# Patient Record
Sex: Male | Born: 1975
Health system: Southern US, Community
[De-identification: ages and names within clinical notes are randomized; demographics above are authoritative.]

## PROBLEM LIST (undated history)

## (undated) DIAGNOSIS — E785 Hyperlipidemia, unspecified: Secondary | ICD-10-CM

## (undated) DIAGNOSIS — I10 Essential (primary) hypertension: Secondary | ICD-10-CM

## (undated) HISTORY — PX: VASECTOMY: SHX75

## (undated) HISTORY — DX: Hyperlipidemia, unspecified: E78.5

---
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
## 2016-05-09 ENCOUNTER — Encounter (HOSPITAL_BASED_OUTPATIENT_CLINIC_OR_DEPARTMENT_OTHER): Payer: Self-pay | Admitting: *Deleted

## 2016-05-09 ENCOUNTER — Emergency Department (HOSPITAL_BASED_OUTPATIENT_CLINIC_OR_DEPARTMENT_OTHER)
Admission: EM | Admit: 2016-05-09 | Discharge: 2016-05-09 | Disposition: A | Discharge status: Home / Self Care | Payer: BLUE CROSS/BLUE SHIELD | Attending: Physician Assistant | Admitting: Physician Assistant

## 2016-05-09 DIAGNOSIS — T65891A Toxic effect of other specified substances, accidental (unintentional), initial encounter: Secondary | ICD-10-CM | POA: Insufficient documentation

## 2016-05-09 DIAGNOSIS — H539 Unspecified visual disturbance: Secondary | ICD-10-CM | POA: Diagnosis not present

## 2016-05-09 DIAGNOSIS — H10211 Acute toxic conjunctivitis, right eye: Secondary | ICD-10-CM | POA: Insufficient documentation

## 2016-05-09 MED ORDER — TETRACAINE HCL 0.5 % OP SOLN
OPHTHALMIC | Status: AC
  Administered 2016-05-09: 10:00:00
  Filled 2016-05-09: qty 4

## 2016-05-09 MED ORDER — POLYMYXIN B-TRIMETHOPRIM 10000-0.1 UNIT/ML-% OP SOLN: 1.0000 [drp] | OPHTHALMIC | 0 refills | Status: DC

## 2016-05-09 MED ORDER — FLUORESCEIN SODIUM 1 MG OP STRP
ORAL_STRIP | OPHTHALMIC | Status: AC
  Administered 2016-05-09: 10:00:00
  Filled 2016-05-09: qty 1

## 2016-05-09 NOTE — Discharge Instructions (Signed)
Read the information below.   Your eye was irrigated in the ED. No corneal abrasions or ulcerations were identified. Your intra-ocular pressure was normal.  You can use OTC artifical tears for sympomatic relief of eye irritation. I have prescribed an antibiotic eye drop. Take as directed.  Call your ophthalmologist ASAP to schedule a follow up for re-evaluation.  Use the prescribed medication as directed.  Please discuss all new medications with your pharmacist.  You may return to the Emergency Department at any time for worsening condition or any new symptoms that concern you. Return to ED if you develop  changes in vision, purulent drainage, or pain with eye movement.

## 2016-05-09 NOTE — ED Triage Notes (Signed)
Pt reports splashing bleach onto his face while cleaning this morning. Pt states he got into the shower and flushed both eyes with water. Denies any pain at this time, "it just feels a little irritated." pt denies any visual changes.

## 2016-05-09 NOTE — ED Notes (Signed)
Eye irrigation complete. Tolerated well.

## 2016-05-09 NOTE — ED Provider Notes (Signed)
[facility] Provider Note   CSN: [id number] Arrival date & time: 05/09/16  [id number]  First Provider Contact:  First MD Initiated Contact with Patient 05/09/16 [id number]        History   Chief Complaint Chief Complaint  Patient presents with  . Eye Problem    HPI Jeremy Carlson is a 40 y.o. male.  Jeremy Carlson is a 40 y.o. Male with history of hyperlipidemia presents to ED with complaint of bleach in eye. Patient reports that he was cleaning the floor this morning when the cap on the bleach popped off splashing bleach into his right eye. He was wearing glasses at the time. He irrigated his right eye in the sink followed by in the shower for 10 minutes prior to arrival. He reports he initially had some blurry vision after irrigation; however, that has since resolved. He does endorse some right eye redness and irritation. He denies a foreign body sensation, pain with ocular movements, photophobia, eye discharge, eye itching. No other trauma reported. He reports "caffeine" headache that is mild. He denies any other complaints. He does have an ophthalmologist.      History reviewed. No pertinent past medical history.  There are no active problems to display for this patient.   History reviewed. No pertinent surgical history.     Home Medications    Prior to Admission medications   Medication Sig Start Date End Date Taking? Authorizing Provider  trimethoprim-polymyxin b (POLYTRIM) ophthalmic solution Place 1 drop into the right eye every 4 (four) hours. For 7 days 05/09/16   Roxanna Mew, PA-C    Family History History reviewed. No pertinent family history.  Social History Social History  Substance Use Topics  . Smoking status: Never Smoker  . Smokeless tobacco: Never Used  . Alcohol use Not on file     Allergies   Review of patient's allergies indicates no known allergies.   Review of Systems Review of Systems  Eyes: Positive for pain (  "irritation" no pain with EOMs), redness and visual disturbance (blurry vision after irrigation, resolved).  Neurological: Positive for headaches ( "caffeine headache," slight).  All other systems reviewed and are negative.    Physical Exam Updated Vital Signs BP (!) 143/102 (BP Location: Left Arm)   Pulse 80   Temp 98.2 F (36.8 C) (Oral)   Resp 18   Ht 5\' 8"  (1.727 m)   Wt 90.7 kg   SpO2 99%   BMI 30.41 kg/m   Physical Exam  Constitutional: He appears well-developed and well-nourished. No distress.  HENT:  Head: Normocephalic and atraumatic.  Eyes: EOM and lids are normal. Right eye exhibits no chemosis, no discharge, no exudate and no hordeolum. No foreign body present in the right eye. Left eye exhibits no chemosis, no discharge, no exudate and no hordeolum. No foreign body present in the left eye. Right conjunctiva is injected ( mild). Right conjunctiva has no hemorrhage. Left conjunctiva is not injected. Left conjunctiva has no hemorrhage. No scleral icterus.  Fundoscopic exam:      The right eye shows no AV nicking, no exudate and no hemorrhage. The right eye shows red reflex.       The left eye shows no AV nicking, no exudate and no hemorrhage. The left eye shows red reflex.  Visual acuity is 20/20 L, 20/15 R, uncorrected. No periorbital swelling. No proptosis. No eye lid lacerations. Lids are soft. No TTP or bony defects of orbital rim. PERRL. PH is  normal. IOP is 17. On fluorescein stain - no corneal foreign bodies, abrasions, or ulcerations. Anterior chamber negative for hyphema or hypopyon.   Neck: Normal range of motion.  Abdominal: Soft. Bowel sounds are normal. There is no tenderness.  Skin: He is not diaphoretic.     ED Treatments / Results  Labs (all labs ordered are listed, but only abnormal results are displayed) Labs Reviewed - No data to display  EKG  EKG Interpretation None       Radiology No results found.  Procedures Procedures (including  critical care time)  Medications Ordered in ED Medications  fluorescein 1 MG ophthalmic strip (  Given 05/09/16 0940)  tetracaine (PONTOCAINE) 0.5 % ophthalmic solution (  Given 05/09/16 0940)     Initial Impression / Assessment and Plan / ED Course  I have reviewed the triage vital signs and the nursing notes.  Pertinent labs & imaging results that were available during my care of the patient were reviewed by me and considered in my medical decision making (see chart for details).  Clinical Course    Patient presents to ED following eye chemical exposure. Visual acuity is 20/20 right, 20/15 in left corrected. Mild redness noted to right conjunctiva. EOMs intact without pain. Grossly normal funduscopic exam. Normal slit lamp. PH normal. No corneal abrasion or ulceration. IOP 17. Will irrigate with Lilia Pro lens.  Suspect chemical conjunctivitis. Symptomatic management discussed. Rx antibiotic eye drops. Encouraged follow-up with ophthalmologist in next 2-3 days for reevaluation. Discussed return precautions. Patient voiced understanding and is agreeable.     Final Clinical Impressions(s) / ED Diagnoses   Final diagnoses:  Chemical conjunctivitis, right    New Prescriptions New Prescriptions   TRIMETHOPRIM-POLYMYXIN B (POLYTRIM) OPHTHALMIC SOLUTION    Place 1 drop into the right eye every 4 (four) hours. For 7 days     Roxanna Mew, [place] 05/09/16 [place], MD 05/09/16 1119

## 2016-05-09 NOTE — ED Notes (Signed)
Morgan lens to right eye with NS infusing. Tolerating well.

## 2019-06-24 ENCOUNTER — Other Ambulatory Visit: Payer: Self-pay

## 2019-06-24 ENCOUNTER — Ambulatory Visit: Payer: Self-pay

## 2019-06-24 DIAGNOSIS — Z23 Encounter for immunization: Secondary | ICD-10-CM

## 2020-01-11 ENCOUNTER — Encounter: Payer: Self-pay | Admitting: Medical

## 2020-01-11 ENCOUNTER — Other Ambulatory Visit: Payer: Self-pay

## 2020-01-11 ENCOUNTER — Ambulatory Visit (INDEPENDENT_AMBULATORY_CARE_PROVIDER_SITE_OTHER): Payer: No Typology Code available for payment source | Admitting: Medical

## 2020-01-11 VITALS — BP 130/80 | HR 81 | Temp 98.4°F | Resp 18 | Ht 68.0 in | Wt 194.4 lb

## 2020-01-11 DIAGNOSIS — R030 Elevated blood-pressure reading, without diagnosis of hypertension: Secondary | ICD-10-CM | POA: Insufficient documentation

## 2020-01-11 DIAGNOSIS — E785 Hyperlipidemia, unspecified: Secondary | ICD-10-CM | POA: Insufficient documentation

## 2020-01-11 NOTE — Progress Notes (Signed)
Subjective:    Patient ID: Jeremy Carlson, male    DOB: [birth year], 44 y.o.   MRN: [id number]  HPI  Pt in for first time.   He was going to [facility] or [facility]. 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 Pt just started to watch what he is eating. He has been walking with his wife. He does not drink alcohol. None smoker.  Pt has lost 9-10 pounds with purposeful weight loss over a month. He noticed his blood pressure has his bp has been 140/90 on average. When he gave blood they told him he had high blood pressure.   Today earlier when he took his bp was 124/86. But first reading was 140/90.  He does 2-3 readings at a time.  Pt bp was 123/86 before he came in.  In the past he had some high cholesterol.  Pt has new blood pressure cuff.  Pt bp initially on checking was 150/80.      Review of Systems  Constitutional: Negative for chills, fatigue and fever.  Respiratory: Negative for cough, chest tightness, shortness of breath and wheezing.   Cardiovascular: Negative for chest pain and palpitations.  Gastrointestinal: Negative for abdominal pain.  Genitourinary: Negative for dysuria and frequency.  Musculoskeletal: Negative for back pain.  Skin: Negative for rash and wound.  Neurological: Negative for dizziness, syncope, weakness and light-headedness.  Hematological: Negative for adenopathy. Does not bruise/bleed easily.  Psychiatric/Behavioral: Negative for agitation, confusion, sleep disturbance and suicidal ideas. The patient is not nervous/anxious.     No past medical history on file.   Social History   Socioeconomic History  . Marital status: Married    Spouse name: Not on file  . Number of children: Not on file  . Years of education: Not on file  . Highest education level: Not on file  Occupational History  . Not on file  Tobacco Use  . Smoking status: Never Smoker  . Smokeless tobacco: Never Used  Substance and Sexual Activity  .  Alcohol use: Not on file  . Drug use: Not on file  . Sexual activity: Not on file  Other Topics Concern  . Not on file  Social History Narrative  . Not on file   Social Determinants of Health   Financial Resource Strain:   . Difficulty of Paying Living Expenses:   Food Insecurity:   . Worried About Charity fundraiser in the Last Year:   . Arboriculturist in the Last Year:   Transportation Needs:   . Film/video editor (Medical):   Marland Kitchen Lack of Transportation (Non-Medical):   Physical Activity:   . Days of Exercise per Week:   . Minutes of Exercise per Session:   Stress:   . Feeling of Stress :   Social Connections:   . Frequency of Communication with Friends and Family:   . Frequency of Social Gatherings with Friends and Family:   . Attends Religious Services:   . Active Member of Clubs or Organizations:   . Attends Archivist Meetings:   Marland Kitchen Marital Status:   Intimate Partner Violence:   . Fear of Current or Ex-Partner:   . Emotionally Abused:   Marland Kitchen Physically Abused:   . Sexually Abused:     No past surgical history on file.  No family history on file.  No Known Allergies  Current Outpatient Medications on File Prior to Visit  Medication Sig Dispense Refill  . trimethoprim-polymyxin b (POLYTRIM)  ophthalmic solution Place 1 drop into the right eye every 4 (four) hours. For 7 days (Patient not taking: Reported on 01/11/2020) 10 mL 0   No current facility-administered medications on file prior to visit.    BP (!) 149/106 (BP Location: Left Arm, Patient Position: Sitting, Cuff Size: Large)   Pulse 81   Temp 98.4 F (36.9 C) (Temporal)   Resp 18   Ht 5\' 8"  (1.727 m)   Wt 194 lb 6.4 oz (88.2 kg)   SpO2 100%   BMI 29.56 kg/m       Objective:   Physical Exam   General Mental Status- Alert. General Appearance- Not in acute distress.   Skin General: Color- Normal Color. Moisture- Normal Moisture.  Neck Carotid Arteries- Normal color. Moisture-  Normal Moisture. No carotid bruits. No JVD.  Chest and Lung Exam Auscultation: Breath Sounds:-Normal.  Cardiovascular Auscultation:Rythm- Regular. Murmurs & Other Heart Sounds:Auscultation of the heart reveals- No Murmurs.  Abdomen Inspection:-Inspeection Normal. Palpation/Percussion:Note:No mass. Palpation and Percussion of the abdomen reveal- Non Tender, Non Distended + BS, no rebound or guarding.   Neurologic Cranial Nerve exam:- CN III-XII intact(No nystagmus), symmetric smile. Strength:- 5/5 equal and symmetric strength both upper and lower extremities.     Assessment & Plan:  Your blood pressure readings have been high recently but better as you lost weight and reduced salt. You at home readings and readings today indicate you may have some white coat type bp elevation. So continue with weight loss and low salt diet. Will check you bp machine against manual check. Won't give medications presently.  Will include lipid panel with your hx of some elevation in the past.   Follow up 3 weeks early morning wellness exam. Come in fasting.  Time spent with new patient today was 30  minutes which consisted of chart review, discussing elevated bp, monitor and potential treatment if necessary and documentation.  Mackie Pai, PA-C

## 2020-01-11 NOTE — Patient Instructions (Signed)
Your blood pressure readings have been high recently but better as you lost weight and reduced salt. You at home readings and readings today indicate you may have some white coat type bp elevation. So continue with weight loss and low salt diet. Will check you bp machine against manual check. Won't give medications presently.  Will include lipid panel with your hx of some elevation in the past.   Follow up 3 weeks early morning wellness exam. Come in fasting.

## 2020-02-01 ENCOUNTER — Other Ambulatory Visit: Payer: Self-pay

## 2020-02-01 ENCOUNTER — Ambulatory Visit (INDEPENDENT_AMBULATORY_CARE_PROVIDER_SITE_OTHER): Payer: No Typology Code available for payment source | Admitting: Medical

## 2020-02-01 ENCOUNTER — Encounter: Payer: Self-pay | Admitting: Medical

## 2020-02-01 VITALS — BP 137/99 | HR 84 | Temp 96.9°F | Resp 18 | Ht 68.0 in | Wt 190.2 lb

## 2020-02-01 DIAGNOSIS — Z1211 Encounter for screening for malignant neoplasm of colon: Secondary | ICD-10-CM | POA: Diagnosis not present

## 2020-02-01 DIAGNOSIS — R030 Elevated blood-pressure reading, without diagnosis of hypertension: Secondary | ICD-10-CM

## 2020-02-01 DIAGNOSIS — Z1283 Encounter for screening for malignant neoplasm of skin: Secondary | ICD-10-CM

## 2020-02-01 DIAGNOSIS — Z0000 Encounter for general adult medical examination without abnormal findings: Secondary | ICD-10-CM | POA: Diagnosis not present

## 2020-02-01 DIAGNOSIS — E663 Overweight: Secondary | ICD-10-CM | POA: Diagnosis not present

## 2020-02-01 DIAGNOSIS — Z113 Encounter for screening for infections with a predominantly sexual mode of transmission: Secondary | ICD-10-CM

## 2020-02-01 DIAGNOSIS — R0683 Snoring: Secondary | ICD-10-CM | POA: Diagnosis not present

## 2020-02-01 LAB — COMPREHENSIVE METABOLIC PANEL
ALT: 14 U/L (ref 0–53)
AST: 14 U/L (ref 0–37)
Albumin: 4.5 g/dL (ref 3.5–5.2)
Alkaline Phosphatase: 80 U/L (ref 39–117)
BUN: 13 mg/dL (ref 6–23)
CO2: 27 mEq/L (ref 19–32)
Calcium: 9.2 mg/dL (ref 8.4–10.5)
Chloride: 104 mEq/L (ref 96–112)
Creatinine, Ser: 0.8 mg/dL (ref 0.40–1.50)
GFR: 104.49 mL/min (ref >60.00)
Glucose, Bld: 91 mg/dL (ref 70–99)
Potassium: 3.7 mEq/L (ref 3.5–5.1)
Sodium: 139 mEq/L (ref 135–145)
Total Bilirubin: 0.4 mg/dL (ref 0.2–1.2)
Total Protein: 7.3 g/dL (ref 6.0–8.3)

## 2020-02-01 LAB — CBC WITH DIFFERENTIAL/PLATELET
Basophils Absolute: 0.1 10*3/uL (ref 0.0–0.1)
Basophils Relative: 1.2 % (ref 0.0–3.0)
Eosinophils Absolute: 0.1 10*3/uL (ref 0.0–0.7)
Eosinophils Relative: 1.3 % (ref 0.0–5.0)
HCT: 42.8 % (ref 39.0–52.0)
Hemoglobin: 14.5 g/dL (ref 13.0–17.0)
Lymphocytes Relative: 23.2 % (ref 12.0–46.0)
Lymphs Abs: 1.7 10*3/uL (ref 0.7–4.0)
MCHC: 33.8 g/dL (ref 30.0–36.0)
MCV: 87.8 fl (ref 78.0–100.0)
Monocytes Absolute: 0.5 10*3/uL (ref 0.1–1.0)
Monocytes Relative: 6.5 % (ref 3.0–12.0)
Neutro Abs: 4.9 10*3/uL (ref 1.4–7.7)
Neutrophils Relative %: 67.8 % (ref 43.0–77.0)
Platelets: 216 10*3/uL (ref 150.0–400.0)
RBC: 4.88 Mil/uL (ref 4.22–5.81)
RDW: 13.5 % (ref 11.5–15.5)
WBC: 7.2 10*3/uL (ref 4.0–10.5)

## 2020-02-01 LAB — LIPID PANEL
Cholesterol: 196 mg/dL (ref 0–200)
HDL: 35.5 mg/dL — ABNORMAL LOW (ref >39.00)
LDL Cholesterol: 135 mg/dL — ABNORMAL HIGH (ref 0–99)
NonHDL: 160.89
Total CHOL/HDL Ratio: 6
Triglycerides: 129 mg/dL (ref 0.0–149.0)
VLDL: 25.8 mg/dL (ref 0.0–40.0)

## 2020-02-01 NOTE — Patient Instructions (Addendum)
For you wellness exam today I have ordered cbc, cmp, lipid panel and hiv.  Vaccine up to date except pt not sure about tdap. Can give later if you want to see if greater than 10 years and update [place].  Recommend exercise and healthy diet.  We will let you know lab results as they come in.  Follow up date appointment will be determined after lab review.   Referral for screening colonoscopy.  For elevated bp in office but better at home continue to check at home and relax. Want to see most of time less than 130/80. If closer to 140/90 then rx low dose medication.  Overweight but succesfully loosing wt. Will put in referral for nutritionist.  For snoring placed referral to specialist. May get sleep study.   Preventive Care 22-62 Years Old, Male Preventive care refers to lifestyle choices and visits with your health care provider that can promote health and wellness. This includes:  A yearly physical exam. This is also called an annual well check.  Regular dental and eye exams.  Immunizations.  Screening for certain conditions.  Healthy lifestyle choices, such as eating a healthy diet, getting regular exercise, not using drugs or products that contain nicotine and tobacco, and limiting alcohol use. What can I expect for my preventive care visit? Physical exam Your health care provider will check:  Height and weight. These may be used to calculate body mass index (BMI), which is a measurement that tells if you are at a healthy weight.  Heart rate and blood pressure.  Your skin for abnormal spots. Counseling Your health care provider may ask you questions about:  Alcohol, tobacco, and drug use.  Emotional well-being.  Home and relationship well-being.  Sexual activity.  Eating habits.  Work and work Statistician. What immunizations do I need?  Influenza (flu) vaccine  This is recommended every year. Tetanus, diphtheria, and pertussis (Tdap) vaccine  You may need a  Td booster every 10 years. Varicella (chickenpox) vaccine  You may need this vaccine if you have not already been vaccinated. Zoster (shingles) vaccine  You may need this after age 43. Measles, mumps, and rubella (MMR) vaccine  You may need at least one dose of MMR if you were born in 1957 or later. You may also need a second dose. Pneumococcal conjugate (PCV13) vaccine  You may need this if you have certain conditions and were not previously vaccinated. Pneumococcal polysaccharide (PPSV23) vaccine  You may need one or two doses if you smoke cigarettes or if you have certain conditions. Meningococcal conjugate (MenACWY) vaccine  You may need this if you have certain conditions. Hepatitis A vaccine  You may need this if you have certain conditions or if you travel or work in places where you may be exposed to hepatitis A. Hepatitis B vaccine  You may need this if you have certain conditions or if you travel or work in places where you may be exposed to hepatitis B. Haemophilus influenzae type b (Hib) vaccine  You may need this if you have certain risk factors. Human papillomavirus (HPV) vaccine  If recommended by your health care provider, you may need three doses over 6 months. You may receive vaccines as individual doses or as more than one vaccine together in one shot (combination vaccines). Talk with your health care provider about the risks and benefits of combination vaccines. What tests do I need? Blood tests  Lipid and cholesterol levels. These may be checked every 5 years, or  more frequently if you are over 53 years old.  Hepatitis C test.  Hepatitis B test. Screening  Lung cancer screening. You may have this screening every year starting at age 21 if you have a 30-pack-year history of smoking and currently smoke or have quit within the past 15 years.  Prostate cancer screening. Recommendations will vary depending on your family history and other  risks.  Colorectal cancer screening. All adults should have this screening starting at age 36 and continuing until age 59. Your health care provider may recommend screening at age 54 if you are at increased risk. You will have tests every 1-10 years, depending on your results and the type of screening test.  Diabetes screening. This is done by checking your blood sugar (glucose) after you have not eaten for a while (fasting). You may have this done every 1-3 years.  Sexually transmitted disease (STD) testing. Follow these instructions at home: Eating and drinking  Eat a diet that includes fresh fruits and vegetables, whole grains, lean protein, and low-fat dairy products.  Take vitamin and mineral supplements as recommended by your health care provider.  Do not drink alcohol if your health care provider tells you not to drink.  If you drink alcohol: ? Limit how much you have to 0-2 drinks a day. ? Be aware of how much alcohol is in your drink. In the [place] one drink equals one 12 oz bottle of beer (355 mL), one 5 oz glass of wine (148 mL), or one 1 oz glass of hard liquor (44 mL). Lifestyle  Take daily care of your teeth and gums.  Stay active. Exercise for at least 30 minutes on 5 or more days each week.  Do not use any products that contain nicotine or tobacco, such as cigarettes, e-cigarettes, and chewing tobacco. If you need help quitting, ask your health care provider.  If you are sexually active, practice safe sex. Use a condom or other form of protection to prevent STIs (sexually transmitted infections).  Talk with your health care provider about taking a low-dose aspirin every day starting at age 69. What's next?  Go to your health care provider once a year for a well check visit.  Ask your health care provider how often you should have your eyes and teeth checked.  Stay up to date on all vaccines. This information is not intended to replace advice given to you by your  health care provider. Make sure you discuss any questions you have with your health care provider. Document Revised: 09/18/2018 Document Reviewed: 09/18/2018 Elsevier Patient Education  2020 [employer].

## 2020-02-01 NOTE — Progress Notes (Signed)
Subjective:    Patient ID: Jeremy Carlson, male    DOB: Aug 15, 1976, 44 y.o.   MRN: [id number]  HPI  Pt in for cpe/wellness. He is fasting.  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 Pt just started to watch what he is eating. He has been walking with his wife. He does not drink alcohol. None smoker.  This morning when he took his bp was 132/79.  After flying and coming back from [place] his bp was 159/109. In office before I checked he got 144/94 and his second check was 136/96.  At home pt average bp at 116/82.   He has been exercising.   Pt not sure if up to date on tdap. Pt got his covid vaccines in February.    Pt still dieting and continue to have purposeful weight loss.   Review of Systems  Constitutional: Negative for chills, fatigue and fever.  Respiratory: Negative for cough, chest tightness, shortness of breath and wheezing.        Pt does snore per his wife. Wife thinks at time he gasping for air. He fells moderately well rested.  Cardiovascular: Negative for chest pain and palpitations.  Gastrointestinal: Negative for abdominal pain.  Genitourinary: Negative for decreased urine volume, dysuria and testicular pain.  Musculoskeletal: Negative for back pain.  Skin: Negative for rash.  Neurological: Positive for weakness. Negative for dizziness, speech difficulty, numbness and headaches.  Hematological: Negative for adenopathy. Does not bruise/bleed easily.  Psychiatric/Behavioral: Negative for behavioral problems, dysphoric mood and sleep disturbance. The patient is not nervous/anxious.    Past Medical History:  Diagnosis Date  . Hyperlipidemia      Social History   Socioeconomic History  . Marital status: Married    Spouse name: Not on file  . Number of children: Not on file  . Years of education: Not on file  . Highest education level: Not on file  Occupational History  . Not on file  Tobacco Use  . Smoking status: Never Smoker  .  Smokeless tobacco: Never Used  Substance and Sexual Activity  . Alcohol use: Not Currently    Comment: rare  . Drug use: Never  . Sexual activity: Yes  Other Topics Concern  . Not on file  Social History Narrative  . Not on file   Social Determinants of Health   Financial Resource Strain:   . Difficulty of Paying Living Expenses:   Food Insecurity:   . Worried About Charity fundraiser in the Last Year:   . Arboriculturist in the Last Year:   Transportation Needs:   . Film/video editor (Medical):   Marland Kitchen Lack of Transportation (Non-Medical):   Physical Activity:   . Days of Exercise per Week:   . Minutes of Exercise per Session:   Stress:   . Feeling of Stress :   Social Connections:   . Frequency of Communication with Friends and Family:   . Frequency of Social Gatherings with Friends and Family:   . Attends Religious Services:   . Active Member of Clubs or Organizations:   . Attends Archivist Meetings:   Marland Kitchen Marital Status:   Intimate Partner Violence:   . Fear of Current or Ex-Partner:   . Emotionally Abused:   Marland Kitchen Physically Abused:   . Sexually Abused:     Past Surgical History:  Procedure Laterality Date  . VASECTOMY      Family History  Problem Relation Age of Onset  .  Hyperlipidemia Mother   . Hypertension Mother   . Hyperlipidemia Father   . Hypertension Father     No Known Allergies  Current Outpatient Medications on File Prior to Visit  Medication Sig Dispense Refill  . trimethoprim-polymyxin b (POLYTRIM) ophthalmic solution Place 1 drop into the right eye every 4 (four) hours. For 7 days (Patient not taking: Reported on 01/11/2020) 10 mL 0   No current facility-administered medications on file prior to visit.    BP 138/88 (BP Location: Left Arm, Patient Position: Sitting, Cuff Size: Large)   Pulse 84   Temp (!) 96.9 F (36.1 C) (Temporal)   Resp 18   Ht 5\' 8"  (1.727 m)   Wt 190 lb 3.2 oz (86.3 kg)   SpO2 100%   BMI 28.92 kg/m        Objective:   Physical Exam  General Mental Status- Alert. General Appearance- Not in acute distress.   Skin One mole on back little darker than other but not large and slight irregular border. Left rig /under axillary are 5 skin tags.  Neck Carotid Arteries- Normal color. Moisture- Normal Moisture. No carotid bruits. No JVD.  Chest and Lung Exam Auscultation: Breath Sounds:-Normal.  Cardiovascular Auscultation:Rythm- Regular. Murmurs & Other Heart Sounds:Auscultation of the heart reveals- No Murmurs.  Abdomen Inspection:-Inspeection Normal. Palpation/Percussion:Note:No mass. Palpation and Percussion of the abdomen reveal- Non Tender, Non Distended + BS, no rebound or guarding.   Neurologic Cranial Nerve exam:- CN III-XII intact(No nystagmus), symmetric smile. Strength:- 5/5 equal and symmetric strength both upper and lower extremities.      Assessment & Plan:  For you wellness exam today I have ordered cbc, cmp, lipid panel and hiv.  Vaccine up to date except pt not sure about tdap. Can give later if you want to see if greater than 10 years and update [place].  Recommend exercise and healthy diet.  We will let you know lab results as they come in.  Follow up date appointment will be determined after lab review.   Referral for screening colonoscopy.  For elevated bp in office but better at home continue to check at home and relax. Want to see most of time less than 130/80. If closer to 140/90 then rx low dose medication.  Overweight but succcesfully loosing wt. Will put in referral for nutritionist.  For snoring placed referal to specialist. May get sleep study.   [id number] charge as well as addition 10 minutes discussing bp, weight loss, referral to nutritionist, and snoring/referral.

## 2020-02-02 LAB — HIV ANTIBODY (ROUTINE TESTING W REFLEX): HIV 1&2 Ab, 4th Generation: NONREACTIVE

## 2020-02-08 ENCOUNTER — Encounter: Payer: Self-pay | Admitting: Medical

## 2020-02-08 NOTE — Addendum Note (Signed)
Addended by: Anabel Halon on: 02/08/2020 02:01 PM   Modules accepted: Orders

## 2020-02-15 ENCOUNTER — Encounter: Payer: No Typology Code available for payment source | Attending: Medical | Admitting: Skilled Nursing Facility1

## 2020-02-15 ENCOUNTER — Other Ambulatory Visit: Payer: Self-pay

## 2020-02-15 ENCOUNTER — Encounter: Payer: Self-pay | Admitting: Skilled Nursing Facility1

## 2020-02-15 DIAGNOSIS — E785 Hyperlipidemia, unspecified: Secondary | ICD-10-CM | POA: Insufficient documentation

## 2020-02-15 NOTE — Progress Notes (Addendum)
  Assessment:  Primary concerns today: general health   Pt states he has cut out caffeine, added sugar, and is on a low sodium diet and with that have controlled blood pressures more often than previously witth he goal being 130/80. Pt states he has never had a blood pressure issue which worried him enough to make a lot changes. Pt states he has lost 20 pounds since January. Pt states he now walks/jogs 30-60 minutes 5-6 days a week. Pt state she had always had headaches and had to rely on caffeone and needed to be awake but does not have those issues any longer increases his motivation. Pt states he does not get good sleep and feels he needs to lose 20 pounds. Pt states he is going to get a Sleep study soon. Pt states he uses My[employer]itness Pal daily and writes down everything he puts in his mouth.   Body Comp scale:  Wt: 188 % fat: 22.7 Total body water percent: 58.6%  MEDICATIONS: see list   DIETARY INTAKE:  Usual eating pattern includes 3 meals and 2 snacks per day.  Everyday foods include oatmeal  Avoided foods include ihgh sugar/fat.    24-hr recall:  B ( AM): oatmeal With cinnamon with banana and blueberries and greek yogurt with almonds  Snk ( AM): carrots + celery + plain greek yogurt with ranch seasoning  L ( PM): salad: green leaf + carrots + sweet potato in avacado oil + chicken breast + black beans + avacado Snk ( PM): apple or orange  D ( PM): salmon + asparaus + red potatoes  Snk ( PM): sometimes flavored yogurt and dark chocolate  Beverages: water  Usual physical activity: 30-60 minutes walk/jog, tomato juice  Estimated energy needs: 1500-1700 calories  Progress Towards Goal(s):  In progress.     Intervention:  Nutrition counseling. Dietitian advised pt his diet is perfect and he has done an amazing job doing right by his body.   Goals: Let the sodium go; you are not eating anything that would increase it If you continue to lose weight 2+ pounds per week increase your  calories  Increase resistance exercises to 2-3 days a week If counting calories/logging becomes stressful let it go  Teaching Method Utilized:  Visual Auditory Hands on   Barriers to learning/adherence to lifestyle change: none identified   Demonstrated degree of understanding via:  Teach Back   Monitoring/Evaluation:  Dietary intake, exercise,and body weight prn.

## 2020-02-25 ENCOUNTER — Encounter: Payer: Self-pay | Admitting: Pulmonary Disease

## 2020-02-25 ENCOUNTER — Ambulatory Visit (INDEPENDENT_AMBULATORY_CARE_PROVIDER_SITE_OTHER): Payer: No Typology Code available for payment source | Admitting: Pulmonary Disease

## 2020-02-25 ENCOUNTER — Other Ambulatory Visit: Payer: Self-pay

## 2020-02-25 VITALS — BP 126/88 | HR 86 | Temp 98.3°F | Ht 68.0 in | Wt 187.6 lb

## 2020-02-25 DIAGNOSIS — G478 Other sleep disorders: Secondary | ICD-10-CM | POA: Diagnosis not present

## 2020-02-25 DIAGNOSIS — R0683 Snoring: Secondary | ICD-10-CM | POA: Diagnosis not present

## 2020-02-25 NOTE — Progress Notes (Signed)
Jeremy Carlson    [id number]    [birth year]  Primary Care Physician:Saguier, Iris Pert  Referring Physician: Mackie Pai, PA-C [address] [place],  [place] [address]   Chief complaint:   Patient with a history of snoring, nonrestorative sleep  HPI:  Has been having sleep issues for few years Usually goes to bed between 10 and 11 Falls asleep in about 30 minutes 1-3 awakenings Final wake up time between 7 and 8 AM Weight is down about 20 pounds recently  He wakes up most mornings feeling like he had a decent rest Usually has a glass of water by his bedside which he reaches for early in the morning but does not feel he gets too dry He has no headaches currently used to His mom snored  Non-smoker   Outpatient Encounter Medications as of 02/25/2020  Medication Sig  . loratadine (CLARITIN) 10 MG tablet Take 10 mg by mouth daily.  . magnesium 30 MG tablet Take 30 mg by mouth daily.  . Multiple Vitamin (ONE-A-DAY MENS PO) Take by mouth.  . omega-3 acid ethyl esters (LOVAZA) 1 g capsule Take 1 g by mouth daily.  . [DISCONTINUED] trimethoprim-polymyxin b (POLYTRIM) ophthalmic solution Place 1 drop into the right eye every 4 (four) hours. For 7 days (Patient not taking: Reported on 01/11/2020)   No facility-administered encounter medications on file as of 02/25/2020.    Allergies as of 02/25/2020  . (No Known Allergies)    Past Medical History:  Diagnosis Date  . Hyperlipidemia   . Hypertension     Past Surgical History:  Procedure Laterality Date  . VASECTOMY      Family History  Problem Relation Age of Onset  . Hyperlipidemia Mother   . Hypertension Mother   . Hyperlipidemia Father   . Hypertension Father     Social History   Socioeconomic History  . Marital status: Married    Spouse name: Not on file  . Number of children: Not on file  . Years of education: Not on file  . Highest education level: Not on file   Occupational History  . Not on file  Tobacco Use  . Smoking status: Never Smoker  . Smokeless tobacco: Never Used  Substance and Sexual Activity  . Alcohol use: Not Currently    Comment: rare  . Drug use: Never  . Sexual activity: Yes  Other Topics Concern  . Not on file  Social History Narrative  . Not on file   Social Determinants of Health   Financial Resource Strain:   . Difficulty of Paying Living Expenses:   Food Insecurity:   . Worried About Charity fundraiser in the Last Year:   . Arboriculturist in the Last Year:   Transportation Needs:   . Film/video editor (Medical):   Marland Kitchen Lack of Transportation (Non-Medical):   Physical Activity:   . Days of Exercise per Week:   . Minutes of Exercise per Session:   Stress:   . Feeling of Stress :   Social Connections:   . Frequency of Communication with Friends and Family:   . Frequency of Social Gatherings with Friends and Family:   . Attends Religious Services:   . Active Member of Clubs or Organizations:   . Attends Archivist Meetings:   Marland Kitchen Marital Status:   Intimate Partner Violence:   . Fear of Current or Ex-Partner:   .  Emotionally Abused:   Marland Kitchen Physically Abused:   . Sexually Abused:     Review of Systems  Constitutional: Negative.   HENT: Negative.   Psychiatric/Behavioral: Positive for sleep disturbance.    Vitals:   02/25/20 1558  BP: 126/88  Pulse: 86  Temp: 98.3 F (36.8 C)  SpO2: 98%     Physical Exam  HENT:  Head: Normocephalic.  Mallampati 4, crowded oropharynx  Eyes: Pupils are equal, round, and reactive to light. Right eye exhibits no discharge. Left eye exhibits no discharge.  Neck: No tracheal deviation present. No thyromegaly present.  Cardiovascular: Normal rate and regular rhythm.  Pulmonary/Chest: Effort normal and breath sounds normal. No respiratory distress. He has no wheezes. He has no rales. He exhibits no tenderness.  Neurological: He is alert.  Psychiatric: He  has a normal mood and affect.   Results of the Epworth flowsheet 02/25/2020  Sitting and reading 1  Watching TV 1  Sitting, inactive in a public place (e.g. a theatre or a meeting) 0  As a passenger in a car for an hour without a break 0  Lying down to rest in the afternoon when circumstances permit 2  Sitting and talking to someone 0  Sitting quietly after a lunch without alcohol 1  In a car, while stopped for a few minutes in traffic 0  Total score 5    Assessment:  Moderate probability of significant obstructive sleep apnea  Daytime sleepiness  Pathophysiology of sleep disordered breathing discussed with the patient Treatment options for sleep disordered breathing discussed with the patient  Plan/Recommendations: Risk of not treating sleep disordered breathing discussed  We will schedule the patient for home sleep study  Follow-up in 2 to 3 months  Encouraged to continue weight loss efforts     Sherrilyn Rist MD [facility] 02/25/2020, 4:20 PM  CC: Mackie Pai, PA-C

## 2020-02-25 NOTE — Patient Instructions (Signed)
Moderate probability of significant obstructive sleep apnea  We will schedule you for home sleep study Treatment options as discussed  Update you with results  Follow-up in 2 to 3 months  Call with significant concerns Sleep Apnea Sleep apnea is a condition in which breathing pauses or becomes shallow during sleep. Episodes of sleep apnea usually last 10 seconds or longer, and they may occur as many as 20 times an hour. Sleep apnea disrupts your sleep and keeps your body from getting the rest that it needs. This condition can increase your risk of certain health problems, including:  Heart attack.  Stroke.  Obesity.  Diabetes.  Heart failure.  Irregular heartbeat. What are the causes? There are three kinds of sleep apnea:  Obstructive sleep apnea. This kind is caused by a blocked or collapsed airway.  Central sleep apnea. This kind happens when the part of the brain that controls breathing does not send the correct signals to the muscles that control breathing.  Mixed sleep apnea. This is a combination of obstructive and central sleep apnea. The most common cause of this condition is a collapsed or blocked airway. An airway can collapse or become blocked if:  Your throat muscles are abnormally relaxed.  Your tongue and tonsils are larger than normal.  You are overweight.  Your airway is smaller than normal. What increases the risk? You are more likely to develop this condition if you:  Are overweight.  Smoke.  Have a smaller than normal airway.  Are elderly.  Are male.  Drink alcohol.  Take sedatives or tranquilizers.  Have a family history of sleep apnea. What are the signs or symptoms? Symptoms of this condition include:  Trouble staying asleep.  Daytime sleepiness and tiredness.  Irritability.  Loud snoring.  Morning headaches.  Trouble concentrating.  Forgetfulness.  Decreased interest in sex.  Unexplained sleepiness.  Mood  swings.  Personality changes.  Feelings of depression.  Waking up often during the night to urinate.  Dry mouth.  Sore throat. How is this diagnosed? This condition may be diagnosed with:  A medical history.  A physical exam.  A series of tests that are done while you are sleeping (sleep study). These tests are usually done in a sleep lab, but they may also be done at home. How is this treated? Treatment for this condition aims to restore normal breathing and to ease symptoms during sleep. It may involve managing health issues that can affect breathing, such as high blood pressure or obesity. Treatment may include:  Sleeping on your side.  Using a decongestant if you have nasal congestion.  Avoiding the use of depressants, including alcohol, sedatives, and narcotics.  Losing weight if you are overweight.  Making changes to your diet.  Quitting smoking.  Using a device to open your airway while you sleep, such as: ? An oral appliance. This is a custom-made mouthpiece that shifts your lower jaw forward. ? A continuous positive airway pressure (CPAP) device. This device blows air through a mask when you breathe out (exhale). ? A nasal expiratory positive airway pressure (EPAP) device. This device has valves that you put into each nostril. ? A bi-level positive airway pressure (BPAP) device. This device blows air through a mask when you breathe in (inhale) and breathe out (exhale).  Having surgery if other treatments do not work. During surgery, excess tissue is removed to create a wider airway. It is important to get treatment for sleep apnea. Without treatment, this condition can  lead to:  High blood pressure.  Coronary artery disease.  In men, an inability to achieve or maintain an erection (impotence).  Reduced thinking abilities. Follow these instructions at home: Lifestyle  Make any lifestyle changes that your health care provider recommends.  Eat a healthy,  well-balanced diet.  Take steps to lose weight if you are overweight.  Avoid using depressants, including alcohol, sedatives, and narcotics.  Do not use any products that contain nicotine or tobacco, such as cigarettes, e-cigarettes, and chewing tobacco. If you need help quitting, ask your health care provider. General instructions  Take over-the-counter and prescription medicines only as told by your health care provider.  If you were given a device to open your airway while you sleep, use it only as told by your health care provider.  If you are having surgery, make sure to tell your health care provider you have sleep apnea. You may need to bring your device with you.  Keep all follow-up visits as told by your health care provider. This is important. Contact a health care provider if:  The device that you received to open your airway during sleep is uncomfortable or does not seem to be working.  Your symptoms do not improve.  Your symptoms get worse. Get help right away if:  You develop: ? Chest pain. ? Shortness of breath. ? Discomfort in your back, arms, or stomach.  You have: ? Trouble speaking. ? Weakness on one side of your body. ? Drooping in your face. These symptoms may represent a serious problem that is an emergency. Do not wait to see if the symptoms will go away. Get medical help right away. Call your local emergency services (911 in the [place] Do not drive yourself to the hospital. Summary  Sleep apnea is a condition in which breathing pauses or becomes shallow during sleep.  The most common cause is a collapsed or blocked airway.  The goal of treatment is to restore normal breathing and to ease symptoms during sleep. This information is not intended to replace advice given to you by your health care provider. Make sure you discuss any questions you have with your health care provider. Document Revised: 03/11/2019 Document Reviewed: 05/20/2018 Elsevier  Patient Education  [place].

## 2020-02-25 NOTE — Addendum Note (Signed)
Addended by: Robbie Louis on: 02/25/2020 04:32 PM   Modules accepted: Orders

## 2020-03-10 ENCOUNTER — Telehealth: Payer: Self-pay | Admitting: Pulmonary Disease

## 2020-03-10 NOTE — Telephone Encounter (Signed)
Suanne Marker have you heard anything on this PA for the HST

## 2020-03-11 NOTE — Telephone Encounter (Signed)
I will work on this one.  Per Suanne Marker, she has not done anything to for the PA yet.

## 2020-03-14 NOTE — Telephone Encounter (Signed)
Per Rudene Christians, pt must contact them for the referral to Dr. Olalere/[facility].  Once that has been done, we can initiate a PA for the HST.  I have notified pt who will call me once this has been done.

## 2020-03-21 NOTE — Telephone Encounter (Signed)
I spoke to [place] w/ [facility] who advised that an authorization was initiated by [place] on 6/9, however, they did not receive the clinicals for this.  I faxed it to [phone] ATTN:  Georgana Curio.  Rec'd fax confirm.  Once Josem Kaufmann has been received, we will contact the pt to schedule.  Called pt who verbalized understanding & nothing further needed at this time.

## 2020-04-04 ENCOUNTER — Encounter: Payer: Self-pay | Admitting: Medical

## 2020-04-06 ENCOUNTER — Other Ambulatory Visit: Payer: Self-pay

## 2020-04-06 ENCOUNTER — Ambulatory Visit: Payer: No Typology Code available for payment source

## 2020-04-06 DIAGNOSIS — G4733 Obstructive sleep apnea (adult) (pediatric): Secondary | ICD-10-CM

## 2020-04-06 DIAGNOSIS — R0683 Snoring: Secondary | ICD-10-CM

## 2020-04-11 ENCOUNTER — Telehealth: Payer: Self-pay | Admitting: Pulmonary Disease

## 2020-04-11 DIAGNOSIS — G4733 Obstructive sleep apnea (adult) (pediatric): Secondary | ICD-10-CM | POA: Diagnosis not present

## 2020-04-11 NOTE — Telephone Encounter (Signed)
Call patient  Sleep study result  Date of study: 04/06/2020  Impression: Mild obstructive sleep apnea  Recommendation: Options of treatment for mild obstructive sleep apnea Sleep position optimization by encouraging sleep in a lateral position, elevating the head of the bed by about 30 degrees may help CPAP therapy may be considered if patient has significant daytime symptoms that can be ascribed to be an effect of sleep disordered breathing. An oral device may also be considered as an option of treatment for mild sleep disordered breathing If CPAP therapy is chosen as option of treatment, auto titrating CPAP with pressure settings of 5-15 will be appropriate  Caution against driving while sleepy  Follow-up as scheduled

## 2020-04-12 NOTE — Telephone Encounter (Signed)
Called patient with results of recent home sleep study.  Dr. Ander Slade has reviewed the home sleep test this showed mild sleep apnea with no significant oxygen desaturations.  Dr . Ander Slade recommendations are listed below.   Patient verbalized understanding of results and follow up recall is present for mid August 2021.

## 2020-04-18 ENCOUNTER — Ambulatory Visit (INDEPENDENT_AMBULATORY_CARE_PROVIDER_SITE_OTHER): Payer: No Typology Code available for payment source | Admitting: Physician Assistant

## 2020-04-18 ENCOUNTER — Other Ambulatory Visit: Payer: Self-pay

## 2020-04-18 ENCOUNTER — Encounter: Payer: Self-pay | Admitting: Physician Assistant

## 2020-04-18 DIAGNOSIS — L918 Other hypertrophic disorders of the skin: Secondary | ICD-10-CM

## 2020-04-18 DIAGNOSIS — D225 Melanocytic nevi of trunk: Secondary | ICD-10-CM | POA: Diagnosis not present

## 2020-04-18 DIAGNOSIS — D489 Neoplasm of uncertain behavior, unspecified: Secondary | ICD-10-CM

## 2020-04-18 NOTE — Patient Instructions (Signed)

## 2020-04-18 NOTE — Progress Notes (Signed)
   New Patient Visit  Subjective  Marqual Mi is a 44 y.o. male who presents for the following: Skin Problem (PCP recommended mole on lower right side of back be checked) and Skin Tag (under left arm). He went for physical and PCP noticed a mole on the lower right back. He had not noticed it being there or causing any problem. Tags under left arm been there for years flesh colored, no changes. They don't itch but do get rubbed by clothes.    Objective  Well appearing patient in no apparent distress; mood and affect are within normal limits.  All skin waist up examined not including scalp.  Objective  Mid Back: Brown macule with irregular color     Objective  Left Axilla: Fleshy, skin-colored sessile and pedunculated papules.    Assessment & Plan  Neoplasm of uncertain behavior Mid Back  Skin / nail biopsy Type of biopsy: tangential   Informed consent: discussed and consent obtained   Timeout: patient name, date of birth, surgical site, and procedure verified   Procedure prep:  Patient was prepped and draped in usual sterile fashion (Non sterile) Prep type:  Chlorhexidine Anesthesia: the lesion was anesthetized in a standard fashion   Anesthetic:  1% lidocaine w/ epinephrine 1-100,000 local infiltration Instrument used: flexible razor blade   Hemostasis achieved with: aluminum chloride   Outcome: patient tolerated procedure well   Post-procedure details: wound care instructions given    Specimen 1 - Surgical pathology Differential Diagnosis:  Check Margins: No Darci Needle with irregular color  Skin tag Left Axilla  Epidermal / dermal shaving - Left Axilla  Informed consent: discussed and consent obtained   Timeout: patient name, date of birth, surgical site, and procedure verified   Instrument used: scissors   Hemostasis achieved with: ferric subsulfate   Outcome: patient tolerated procedure well   Additional details:  6 in total left axilla. No  charge.

## 2020-04-19 ENCOUNTER — Telehealth: Payer: Self-pay

## 2020-04-19 NOTE — Telephone Encounter (Signed)
Phone call to patient with his pathology results. Patient aware of results.  

## 2020-04-19 NOTE — Telephone Encounter (Signed)
-----   Message from Arlyss Gandy, PA-C sent at 04/19/2020 11:38 AM EDT ----- 30 min wider shave with me

## 2020-05-09 ENCOUNTER — Encounter: Payer: Self-pay | Admitting: Medical

## 2020-05-09 ENCOUNTER — Other Ambulatory Visit: Payer: Self-pay

## 2020-05-09 ENCOUNTER — Ambulatory Visit (INDEPENDENT_AMBULATORY_CARE_PROVIDER_SITE_OTHER): Payer: No Typology Code available for payment source | Admitting: Medical

## 2020-05-09 VITALS — BP 128/75 | HR 84 | Temp 98.2°F | Resp 18 | Ht 68.0 in | Wt 187.8 lb

## 2020-05-09 DIAGNOSIS — E785 Hyperlipidemia, unspecified: Secondary | ICD-10-CM | POA: Diagnosis not present

## 2020-05-09 DIAGNOSIS — R0683 Snoring: Secondary | ICD-10-CM | POA: Diagnosis not present

## 2020-05-09 NOTE — Patient Instructions (Addendum)
Your bp has been very well controlled at home and acceptable range in our office as well. Continue low salt diet and regular exercise.  For mild high cholesterol recommend low choletesterol diet and exercise. Mild high ldl. With your age and minimal risk factors don't recommend meds presently.  Glad to hear no sleep apnea.  Follow thru with derm procedure.  Follow up in one year or as needed

## 2020-05-09 NOTE — Progress Notes (Signed)
Subjective:    Patient ID: Jeremy Carlson, male    DOB: 01-Feb-1976, 44 y.o.   MRN: [id number]  HPI  Pt in for follow up.  Pt states he has been checking his bp about 3-4 times a week. On average his bp at home 116/75. This am when he go up his bp was 120/80. This max over past 4 weeks.  Pt did see pulmonologist and bp that day was 126/88.  Pt did see dermatologist. Plans to have cancerous lesion removed early September.   Pt is fasting today. Not necessary to get lipid panel today.   Pt insurance won't cover colonoscopy.    Review of Systems  Constitutional: Negative for chills, fatigue and fever.  Respiratory: Negative for cough, chest tightness, shortness of breath and wheezing.   Cardiovascular: Negative for chest pain and palpitations.  Gastrointestinal: Negative for abdominal pain and blood in stool.  Musculoskeletal: Negative for back pain, joint swelling, myalgias and neck stiffness.  Skin: Negative for rash.  Neurological: Negative for dizziness, seizures, weakness and headaches.  Hematological: Negative for adenopathy. Does not bruise/bleed easily.  Psychiatric/Behavioral: Negative for behavioral problems and confusion.    Past Medical History:  Diagnosis Date  . Hyperlipidemia   . Hypertension      Social History   Socioeconomic History  . Marital status: Married    Spouse name: Not on file  . Number of children: Not on file  . Years of education: Not on file  . Highest education level: Not on file  Occupational History  . Not on file  Tobacco Use  . Smoking status: Never Smoker  . Smokeless tobacco: Never Used  Substance and Sexual Activity  . Alcohol use: Not Currently    Comment: rare  . Drug use: Never  . Sexual activity: Yes  Other Topics Concern  . Not on file  Social History Narrative  . Not on file   Social Determinants of Health   Financial Resource Strain:   . Difficulty of Paying Living Expenses:   Food Insecurity:   .  Worried About Charity fundraiser in the Last Year:   . Arboriculturist in the Last Year:   Transportation Needs:   . Film/video editor (Medical):   Marland Kitchen Lack of Transportation (Non-Medical):   Physical Activity:   . Days of Exercise per Week:   . Minutes of Exercise per Session:   Stress:   . Feeling of Stress :   Social Connections:   . Frequency of Communication with Friends and Family:   . Frequency of Social Gatherings with Friends and Family:   . Attends Religious Services:   . Active Member of Clubs or Organizations:   . Attends Archivist Meetings:   Marland Kitchen Marital Status:   Intimate Partner Violence:   . Fear of Current or Ex-Partner:   . Emotionally Abused:   Marland Kitchen Physically Abused:   . Sexually Abused:     Past Surgical History:  Procedure Laterality Date  . VASECTOMY      Family History  Problem Relation Age of Onset  . Hyperlipidemia Mother   . Hypertension Mother   . Hyperlipidemia Father   . Hypertension Father     No Known Allergies  Current Outpatient Medications on File Prior to Visit  Medication Sig Dispense Refill  . loratadine (CLARITIN) 10 MG tablet Take 10 mg by mouth daily.    . magnesium 30 MG tablet Take 30 mg by mouth daily.    Marland Kitchen  Multiple Vitamin (ONE-A-DAY MENS PO) Take by mouth.    . omega-3 acid ethyl esters (LOVAZA) 1 g capsule Take 1 g by mouth daily.     No current facility-administered medications on file prior to visit.    BP (!) 144/94 (BP Location: Left Arm, Patient Position: Sitting, Cuff Size: Large)   Pulse 84   Temp 98.2 F (36.8 C) (Oral)   Resp 18   Ht 5\' 8"  (1.727 m)   Wt 187 lb 12.8 oz (85.2 kg)   SpO2 100%   BMI 28.55 kg/m       Objective:   Physical Exam   General Mental Status- Alert. General Appearance- Not in acute distress.   Skin General: Color- Normal Color. Moisture- Normal Moisture.  Neck Carotid Arteries- Normal color. Moisture- Normal Moisture. No carotid bruits. No JVD.  Chest and  Lung Exam Auscultation: Breath Sounds:-Normal.  Cardiovascular Auscultation:Rythm- Regular. Murmurs & Other Heart Sounds:Auscultation of the heart reveals- No Murmurs.  Abdomen Inspection:-Inspeection Normal. Palpation/Percussion:Note:No mass. Palpation and Percussion of the abdomen reveal- Non Tender, Non Distended + BS, no rebound or guarding.    Neurologic Cranial Nerve exam:- CN III-XII intact(No nystagmus), symmetric smile. Strength:- 5/5 equal and symmetric strength both upper and lower extremities.     Assessment & Plan:  Your bp has been very well controlled at home and acceptable range in our office as well. Continue low salt diet and regular exercise.  For mild high cholesterol recommend low choletesterol diet and exercise. Mild high ldl. With your age and minimal risk factors don't recommend meds presently.  Glad to hear no sleep apnea.  Follow thru with derm procedure.  Follow up in one year or as needed   Mackie Pai, PA-C   Time spent with patient today was 20  minutes which consisted of chart review, discussing diagnosis, work up, treatment and documentation.

## 2020-06-06 ENCOUNTER — Ambulatory Visit (INDEPENDENT_AMBULATORY_CARE_PROVIDER_SITE_OTHER): Payer: No Typology Code available for payment source | Admitting: Physician Assistant

## 2020-06-06 ENCOUNTER — Other Ambulatory Visit: Payer: Self-pay

## 2020-06-06 ENCOUNTER — Encounter: Payer: Self-pay | Admitting: Physician Assistant

## 2020-06-06 DIAGNOSIS — D225 Melanocytic nevi of trunk: Secondary | ICD-10-CM

## 2020-06-06 DIAGNOSIS — D229 Melanocytic nevi, unspecified: Secondary | ICD-10-CM

## 2020-06-06 NOTE — Progress Notes (Signed)
   Follow up Visit  Subjective  Jeremy Carlson is a 44 y.o. male who presents for the following: Procedure [name] mid back- moderate to severe. ). Healed well.  Objective  Well appearing patient in no apparent distress; mood and affect are within normal limits.  A focused examination was performed including back. Relevant physical exam findings are noted in the Assessment and Plan.    Assessment & Plan  Atypical mole Mid Back  Epidermal / dermal shaving - Mid Back  Lesion diameter (cm):  1.1 Informed consent: discussed and consent obtained   Timeout: patient name, date of birth, surgical site, and procedure verified   Anesthesia: the lesion was anesthetized in a standard fashion   Anesthetic:  1% lidocaine w/ epinephrine 1-100,000 local infiltration Instrument used: flexible razor blade   Hemostasis achieved with: aluminum chloride   Outcome: patient tolerated procedure well   Post-procedure details: wound care instructions given    Specimen 1 - Surgical pathology Differential Diagnosis: moderate to severe Check Margins: yes wider shave [id number]

## 2020-06-06 NOTE — Patient Instructions (Signed)

## 2020-09-08 ENCOUNTER — Ambulatory Visit: Payer: No Typology Code available for payment source | Admitting: Physician Assistant

## 2020-10-02 ENCOUNTER — Emergency Department: Admit: 2020-10-02 | Discharge status: Absent without leave | Payer: Self-pay

## 2020-10-02 ENCOUNTER — Encounter: Payer: Self-pay | Admitting: Medical

## 2020-10-02 ENCOUNTER — Other Ambulatory Visit: Payer: Self-pay

## 2020-10-02 ENCOUNTER — Emergency Department
Admission: EM | Admit: 2020-10-02 | Discharge: 2020-10-02 | Disposition: A | Discharge status: Home / Self Care | Payer: No Typology Code available for payment source | Source: Home / Self Care

## 2020-10-02 DIAGNOSIS — J029 Acute pharyngitis, unspecified: Secondary | ICD-10-CM | POA: Diagnosis not present

## 2020-10-02 NOTE — ED Provider Notes (Signed)
[facility]    CSN: [id number] Arrival date & time: 10/02/20  1148      History   Chief Complaint Chief Complaint  Patient presents with  . Sore Throat    HPI Jeremy Carlson is a 44 y.o. male.   Is a 44 year old man making his initial visit to [facility] urgent care.  He is complaining about a sore throat.   Pt presents for c/o sore throat and low grade fever for a couple days. Pt is fully vaccinated for covid. OTC dayquil and nyquil without improvement. Pt has had strep throat in the past.   Patient works for [employer] at home.  His 42 year old son recently had a sore throat but has resolved his symptoms.       Past Medical History:  Diagnosis Date  . Hyperlipidemia     Patient Active Problem List   Diagnosis Date Noted  . Hyperlipidemia 01/11/2020  . Elevated blood pressure reading 01/11/2020    Past Surgical History:  Procedure Laterality Date  . VASECTOMY         Home Medications    Prior to Admission medications   Medication Sig Start Date End Date Taking? Authorizing Provider  loratadine (CLARITIN) 10 MG tablet Take 10 mg by mouth daily.    [provider]  magnesium 30 MG tablet Take 30 mg by mouth daily.    [provider]  Multiple Vitamin (ONE-A-DAY MENS PO) Take by mouth.    [provider]  omega-3 acid ethyl esters (LOVAZA) 1 g capsule Take 1 g by mouth daily.    [provider]    Family History Family History  Problem Relation Age of Onset  . Hyperlipidemia Mother   . Hypertension Mother   . Hyperlipidemia Father   . Hypertension Father     Social History Social History   Tobacco Use  . Smoking status: Never Smoker  . Smokeless tobacco: Never Used  Vaping Use  . Vaping Use: Never used  Substance Use Topics  . Alcohol use: Not Currently    Comment: rare  . Drug use: Never     Allergies   Patient has no known allergies.   Review of Systems Review of Systems   Constitutional: Positive for fever.  HENT: Positive for sore throat.      Physical Exam Triage Vital Signs ED Triage Vitals  Enc Vitals Group     BP 10/02/20 1200 (!) 148/105     Pulse Rate 10/02/20 1200 (!) 101     Resp 10/02/20 1200 17     Temp 10/02/20 1200 98.7 F (37.1 C)     Temp Source 10/02/20 1200 Oral     SpO2 10/02/20 1200 98 %     Weight 10/02/20 1158 190 lb (86.2 kg)     Height 10/02/20 1158 5\' 8"  (1.727 m)     Head Circumference --      Peak Flow --      Pain Score --      Pain Loc --      Pain Edu? --      Excl. in [place]? --    No data found.  Updated Vital Signs BP (!) 148/105 (BP Location: Left Arm)   Pulse (!) 101   Temp 98.7 F (37.1 C) (Oral)   Resp 17   Ht 5\' 8"  (1.727 m)   Wt 86.2 kg   SpO2 98%   BMI 28.89 kg/m    Physical Exam Vitals and nursing  note reviewed.  Constitutional:      Appearance: He is well-developed and normal weight.  HENT:     Right Ear: Tympanic membrane normal.     Left Ear: Tympanic membrane normal.     Nose: Congestion present.     Mouth/Throat:     Mouth: Mucous membranes are moist.     Pharynx: Posterior oropharyngeal erythema present.     Tonsils: No tonsillar exudate.  Eyes:     Conjunctiva/sclera: Conjunctivae normal.  Cardiovascular:     Rate and Rhythm: Normal rate.  Pulmonary:     Effort: Pulmonary effort is normal.  Musculoskeletal:     Cervical back: Normal range of motion and neck supple.  Skin:    General: Skin is warm and dry.  Neurological:     General: No focal deficit present.     Mental Status: He is alert and oriented to person, place, and time.  Psychiatric:        Mood and Affect: Mood normal.        Behavior: Behavior normal.      UC Treatments / Results  Labs (all labs ordered are listed, but only abnormal results are displayed) Labs Reviewed  POCT RAPID STREP A (OFFICE)    EKG   Radiology No results found.  Procedures Procedures (including critical care  time)  Medications Ordered in UC Medications - No data to display  Initial Impression / Assessment and Plan / UC Course  I have reviewed the triage vital signs and the nursing notes.  Pertinent labs & imaging results that were available during my care of the patient were reviewed by me and considered in my medical decision making (see chart for details).    Final Clinical Impressions(s) / UC Diagnoses   Final diagnoses:  Viral pharyngitis     Discharge Instructions     Your strep test is negative.  I believe this is a viral infection and will run its course of the next couple days.  You can use ibuprofen or Tylenol for aches and low-grade temperature.  Often gargling with Listerine or salt water is helpful.    ED Prescriptions    None     I have reviewed the PDMP during this encounter.   Robyn Haber, MD 10/02/20 1231

## 2020-10-02 NOTE — ED Triage Notes (Signed)
Pt presents for c/o sore throat and low grade fever for a couple days. Pt is fully vaccinated for covid. OTC dayquil and nyquil without improvement. Pt has had strep throat in the past.

## 2020-10-02 NOTE — Discharge Instructions (Addendum)
Your strep test is negative.  I believe this is a viral infection and will run its course of the next couple days.  You can use ibuprofen or Tylenol for aches and low-grade temperature.  Often gargling with Listerine or salt water is helpful.

## 2020-11-15 ENCOUNTER — Ambulatory Visit: Payer: No Typology Code available for payment source | Admitting: Physician Assistant

## 2020-12-31 ENCOUNTER — Emergency Department (HOSPITAL_BASED_OUTPATIENT_CLINIC_OR_DEPARTMENT_OTHER)
Admission: EM | Admit: 2020-12-31 | Discharge: 2020-12-31 | Disposition: A | Discharge status: Home / Self Care | Payer: 59 | Attending: Emergency Medicine | Admitting: Emergency Medicine

## 2020-12-31 ENCOUNTER — Encounter (HOSPITAL_BASED_OUTPATIENT_CLINIC_OR_DEPARTMENT_OTHER): Payer: Self-pay | Admitting: *Deleted

## 2020-12-31 ENCOUNTER — Other Ambulatory Visit: Payer: Self-pay

## 2020-12-31 ENCOUNTER — Emergency Department (HOSPITAL_BASED_OUTPATIENT_CLINIC_OR_DEPARTMENT_OTHER): Payer: 59

## 2020-12-31 DIAGNOSIS — S61311A Laceration without foreign body of left index finger with damage to nail, initial encounter: Secondary | ICD-10-CM | POA: Insufficient documentation

## 2020-12-31 DIAGNOSIS — Y92000 Kitchen of unspecified non-institutional (private) residence as  the place of occurrence of the external cause: Secondary | ICD-10-CM | POA: Insufficient documentation

## 2020-12-31 DIAGNOSIS — S6992XA Unspecified injury of left wrist, hand and finger(s), initial encounter: Secondary | ICD-10-CM | POA: Diagnosis not present

## 2020-12-31 DIAGNOSIS — Z23 Encounter for immunization: Secondary | ICD-10-CM | POA: Diagnosis not present

## 2020-12-31 DIAGNOSIS — W269XXA Contact with unspecified sharp object(s), initial encounter: Secondary | ICD-10-CM | POA: Diagnosis not present

## 2020-12-31 DIAGNOSIS — Y9389 Activity, other specified: Secondary | ICD-10-CM | POA: Diagnosis not present

## 2020-12-31 DIAGNOSIS — S61211A Laceration without foreign body of left index finger without damage to nail, initial encounter: Secondary | ICD-10-CM | POA: Diagnosis not present

## 2020-12-31 DIAGNOSIS — S6991XA Unspecified injury of right wrist, hand and finger(s), initial encounter: Secondary | ICD-10-CM | POA: Diagnosis present

## 2020-12-31 MED ORDER — TETANUS-DIPHTH-ACELL PERTUSSIS 5-2.5-18.5 LF-MCG/0.5 IM SUSY
0.5000 mL | PREFILLED_SYRINGE | Freq: Once | INTRAMUSCULAR | Status: AC
  Administered 2020-12-31: 0.5 mL via INTRAMUSCULAR
  Filled 2020-12-31: qty 0.5

## 2020-12-31 NOTE — ED Provider Notes (Signed)
[facility] DEPARTMENT Provider Note   CSN: [id number] Arrival date & time: 12/31/20  1819     History Chief Complaint  Patient presents with  . Laceration    Jeremy Carlson is a 45 y.o. male.  Presents to ER with concern for finger laceration.  He was cutting vegetables in the kitchen when he accidentally struck his left index finger.  He is right-hand dominant.  States that the laceration affected the very tip of his left index finger.  Applied dressing at home.  Bleeding stopped after applying direct pressure.  He denies any medical problems.  Unsure of last tetanus.  Patient and his wife work for [employer] from home.  HPI     Past Medical History:  Diagnosis Date  . Hyperlipidemia     Patient Active Problem List   Diagnosis Date Noted  . Hyperlipidemia 01/11/2020  . Elevated blood pressure reading 01/11/2020    Past Surgical History:  Procedure Laterality Date  . VASECTOMY         Family History  Problem Relation Age of Onset  . Hyperlipidemia Mother   . Hypertension Mother   . Hyperlipidemia Father   . Hypertension Father     Social History   Tobacco Use  . Smoking status: Never Smoker  . Smokeless tobacco: Never Used  Vaping Use  . Vaping Use: Never used  Substance Use Topics  . Alcohol use: Not Currently    Comment: rare  . Drug use: Never    Home Medications Prior to Admission medications   Medication Sig Start Date End Date Taking? Authorizing Provider  loratadine (CLARITIN) 10 MG tablet Take 10 mg by mouth daily.    [provider]  magnesium 30 MG tablet Take 30 mg by mouth daily.    [provider]  Multiple Vitamin (ONE-A-DAY MENS PO) Take by mouth.    [provider]  omega-3 acid ethyl esters (LOVAZA) 1 g capsule Take 1 g by mouth daily.    [provider]    Allergies    Patient has no known allergies.  Review of Systems   Review of Systems  Musculoskeletal:  Positive for arthralgias.  All other systems reviewed and are negative.   Physical Exam Updated Vital Signs BP (!) 147/85   Pulse 63   Temp 98.4 F (36.9 C) (Oral)   Resp 18   Ht 5\' 8"  (1.727 m)   Wt 88.5 kg   SpO2 100%   BMI 29.65 kg/m   Physical Exam Vitals and nursing note reviewed.  Constitutional:      Appearance: He is well-developed.  HENT:     Head: Normocephalic and atraumatic.  Eyes:     Conjunctiva/sclera: Conjunctivae normal.  Cardiovascular:     Rate and Rhythm: Normal rate.     Pulses: Normal pulses.  Pulmonary:     Effort: Pulmonary effort is normal. No respiratory distress.  Musculoskeletal:     Cervical back: Neck supple.     Comments: L hand: there is a small <1 cm laceration across the distal medial tip of index finger with loss of tissue  Skin:    General: Skin is warm and dry.  Neurological:     General: No focal deficit present.     Mental Status: He is alert.  Psychiatric:        Mood and Affect: Mood normal.        Behavior: Behavior normal.     ED Results /  Procedures / Treatments   Labs (all labs ordered are listed, but only abnormal results are displayed) Labs Reviewed - No data to display  EKG None  Radiology DG Finger Index Left  Result Date: 12/31/2020 CLINICAL DATA:  Cut finger while chopping vegetables, initial encounter EXAM: LEFT INDEX FINGER 2+V COMPARISON:  None. FINDINGS: Soft tissue injury is noted consistent with the given clinical history. No acute bony abnormality is seen. No radiopaque foreign body is seen. IMPRESSION: Soft tissue injury without acute bony abnormality. Electronically Signed   By: Inez Catalina M.D.   On: 12/31/2020 20:36    Procedures Procedures   Medications Ordered in ED Medications  Tdap (BOOSTRIX) injection 0.5 mL (0.5 mLs Intramuscular Given 12/31/20 2056)    ED Course  I have reviewed the triage vital signs and the nursing notes.  Pertinent labs & imaging results that were available  during my care of the patient were reviewed by me and considered in my medical decision making (see chart for details).    MDM Rules/Calculators/A&P                          45 year old male presents to ER with concern for left index finger injury.  On physical exam, the very tip of the index finger on the side of the finger has been amputated. A small part of the distal nail and nailbed were included in this amputation injury. But the remainder of the nail and finger appear intact. There was no tissue left to approximate together. I performed copious irrigation, and placed xeroform dressing and bulky gauze dressing. Bleeding stopped with direct pressure. I recommend he follow up with hand surgery early this coming week.   After the discussed management above, the patient was determined to be safe for discharge.  The patient was in agreement with this plan and all questions regarding their care were answered.  ED return precautions were discussed and the patient will return to the ED with any significant worsening of condition.   Final Clinical Impression(s) / ED Diagnoses Final diagnoses:  Injury of finger of left hand, initial encounter    Rx / DC Orders ED Discharge Orders    None       Lucrezia Starch, MD 01/01/21 1609

## 2020-12-31 NOTE — Discharge Instructions (Addendum)
Follow wound care instructions as discussed.  Please schedule close follow-up appointment with hand surgery, ideally to be seen on Monday or Tuesday.

## 2020-12-31 NOTE — ED Notes (Signed)
Pt seen, tx and dc prior to this RN assessment. Pt L index finger is wrapped in a bulky dressing with no bleed through at this time. Supplies given to pt and visitor. Discharge paperwork reviewed with patient, and all questions answered.

## 2020-12-31 NOTE — ED Triage Notes (Signed)
States was cutting vegetables and cut off tip of left index finger. Reports difficulty stopping bleeding. Bandage not removed in triage

## 2021-01-04 DIAGNOSIS — S68121A Partial traumatic metacarpophalangeal amputation of left index finger, initial encounter: Secondary | ICD-10-CM | POA: Diagnosis not present

## 2021-02-02 ENCOUNTER — Ambulatory Visit: Payer: Self-pay | Admitting: Physician Assistant

## 2022-02-07 ENCOUNTER — Ambulatory Visit (INDEPENDENT_AMBULATORY_CARE_PROVIDER_SITE_OTHER): Payer: 59 | Admitting: Medical

## 2022-02-07 ENCOUNTER — Encounter: Payer: Self-pay | Admitting: Medical

## 2022-02-07 VITALS — BP 129/78 | HR 85 | Temp 98.3°F | Resp 18 | Ht 68.0 in | Wt 196.0 lb

## 2022-02-07 DIAGNOSIS — Z0000 Encounter for general adult medical examination without abnormal findings: Secondary | ICD-10-CM | POA: Diagnosis not present

## 2022-02-07 DIAGNOSIS — Z1211 Encounter for screening for malignant neoplasm of colon: Secondary | ICD-10-CM | POA: Diagnosis not present

## 2022-02-07 LAB — LIPID PANEL
Cholesterol: 229 mg/dL — ABNORMAL HIGH (ref 0–200)
HDL: 43.9 mg/dL (ref >39.00)
NonHDL: 184.88
Total CHOL/HDL Ratio: 5
Triglycerides: 209 mg/dL — ABNORMAL HIGH (ref 0.0–149.0)
VLDL: 41.8 mg/dL — ABNORMAL HIGH (ref 0.0–40.0)

## 2022-02-07 LAB — COMPREHENSIVE METABOLIC PANEL
ALT: 23 U/L (ref 0–53)
AST: 19 U/L (ref 0–37)
Albumin: 4.7 g/dL (ref 3.5–5.2)
Alkaline Phosphatase: 75 U/L (ref 39–117)
BUN: 16 mg/dL (ref 6–23)
CO2: 26 mEq/L (ref 19–32)
Calcium: 9.3 mg/dL (ref 8.4–10.5)
Chloride: 103 mEq/L (ref 96–112)
Creatinine, Ser: 0.83 mg/dL (ref 0.40–1.50)
GFR: 104.51 mL/min (ref >60.00)
Glucose, Bld: 91 mg/dL (ref 70–99)
Potassium: 4.1 mEq/L (ref 3.5–5.1)
Sodium: 139 mEq/L (ref 135–145)
Total Bilirubin: 0.5 mg/dL (ref 0.2–1.2)
Total Protein: 7.4 g/dL (ref 6.0–8.3)

## 2022-02-07 LAB — CBC WITH DIFFERENTIAL/PLATELET
Basophils Absolute: 0.1 10*3/uL (ref 0.0–0.1)
Basophils Relative: 1.1 % (ref 0.0–3.0)
Eosinophils Absolute: 0.1 10*3/uL (ref 0.0–0.7)
Eosinophils Relative: 1.9 % (ref 0.0–5.0)
HCT: 43 % (ref 39.0–52.0)
Hemoglobin: 14.7 g/dL (ref 13.0–17.0)
Lymphocytes Relative: 27.1 % (ref 12.0–46.0)
Lymphs Abs: 2 10*3/uL (ref 0.7–4.0)
MCHC: 34.1 g/dL (ref 30.0–36.0)
MCV: 87.1 fl (ref 78.0–100.0)
Monocytes Absolute: 0.5 10*3/uL (ref 0.1–1.0)
Monocytes Relative: 7.1 % (ref 3.0–12.0)
Neutro Abs: 4.6 10*3/uL (ref 1.4–7.7)
Neutrophils Relative %: 62.8 % (ref 43.0–77.0)
Platelets: 245 10*3/uL (ref 150.0–400.0)
RBC: 4.94 Mil/uL (ref 4.22–5.81)
RDW: 13.6 % (ref 11.5–15.5)
WBC: 7.3 10*3/uL (ref 4.0–10.5)

## 2022-02-07 LAB — LDL CHOLESTEROL, DIRECT: Direct LDL: 157 mg/dL

## 2022-02-07 NOTE — Addendum Note (Signed)
Addended by: Anabel Halon on: 02/07/2022 08:28 AM ? ? Modules accepted: Orders ? ?

## 2022-02-07 NOTE — Progress Notes (Signed)
? ?Subjective:  ? ? Patient ID: Jeremy Carlson, male    DOB: [birth year], 46 y.o.   MRN: [id number] ? ?HPI ? 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 Pt just started to watch what he is eating. He has been walking with his wife. He does not drink alcohol. None smoker. ? ? ?Pt has been checking his blood pressure at home. Usually around 120/80 range. ? ?Pt has not had colonoscopy. ? ? ? ?Review of Systems  ?Constitutional:  Negative for chills and fatigue.  ?HENT:  Negative for congestion, facial swelling, postnasal drip and rhinorrhea.   ?Respiratory:  Negative for cough, chest tightness, shortness of breath and wheezing.   ?Cardiovascular:  Negative for chest pain and palpitations.  ?Gastrointestinal:  Negative for abdominal pain, blood in stool and constipation.  ?Genitourinary:  Negative for dysuria, flank pain and frequency.  ?Musculoskeletal:  Negative for back pain and joint swelling.  ?Skin:  Negative for rash.  ?Neurological:  Negative for syncope, facial asymmetry, speech difficulty and light-headedness.  ?Hematological:  Negative for adenopathy. Does not bruise/bleed easily.  ?Psychiatric/Behavioral:  Negative for agitation, confusion and dysphoric mood.   ? ? ?Past Medical History:  ?Diagnosis Date  ? Hyperlipidemia   ? ?  ?Social History  ? ?Socioeconomic History  ? Marital status: Married  ?  Spouse name: Not on file  ? Number of children: Not on file  ? Years of education: Not on file  ? Highest education level: Not on file  ?Occupational History  ? Not on file  ?Tobacco Use  ? Smoking status: Never  ? Smokeless tobacco: Never  ?Vaping Use  ? Vaping Use: Never used  ?Substance and Sexual Activity  ? Alcohol use: Not Currently  ?  Comment: rare  ? Drug use: Never  ? Sexual activity: Yes  ?Other Topics Concern  ? Not on file  ?Social History Narrative  ? Not on file  ? ?Social Determinants of Health  ? ?Financial Resource Strain: Not on file  ?Food Insecurity: Not on file   ?Transportation Needs: Not on file  ?Physical Activity: Not on file  ?Stress: Not on file  ?Social Connections: Not on file  ?Intimate Partner Violence: Not on file  ? ? ?Past Surgical History:  ?Procedure Laterality Date  ? VASECTOMY    ? ? ?Family History  ?Problem Relation Age of Onset  ? Hyperlipidemia Mother   ? Hypertension Mother   ? Hyperlipidemia Father   ? Hypertension Father   ? ? ?No Known Allergies ? ?Current Outpatient Medications on File Prior to Visit  ?Medication Sig Dispense Refill  ? loratadine (CLARITIN) 10 MG tablet Take 10 mg by mouth daily.    ? magnesium 30 MG tablet Take 30 mg by mouth daily.    ? Multiple Vitamin (ONE-A-DAY MENS PO) Take by mouth.    ? omega-3 acid ethyl esters (LOVAZA) 1 g capsule Take 1 g by mouth daily.    ? ?No current facility-administered medications on file prior to visit.  ? ? ?BP 140/90   Pulse 85   Temp 98.3 ?F (36.8 ?C)   Resp 18   Ht '5\' 8"'$  (1.727 m)   Wt 196 lb (88.9 kg)   SpO2 97%   BMI 29.80 kg/m?  ?  ?   ?Objective:  ? Physical Exam ? ?General ?Mental Status- Alert. General Appearance- Not in acute distress.  ? ?Skin ?General: Color- Normal Color. Moisture- Normal Moisture. ? ?Neck ?Carotid Arteries- Normal color.  Moisture- Normal Moisture. No carotid bruits. No JVD. ? ?Chest and Lung Exam ?Auscultation: ?Breath Sounds:-Normal. ? ?Cardiovascular ?Auscultation:Rythm- Regular. ?Murmurs & Other Heart Sounds:Auscultation of the heart reveals- No Murmurs. ? ?Abdomen ?Inspection:-Inspeection Normal. ?Palpation/Percussion:Note:No mass. Palpation and Percussion of the abdomen reveal- Non Tender, Non Distended + BS, no rebound or guarding. ? ? ?Neurologic ?Cranial Nerve exam:- CN III-XII intact(No nystagmus), symmetric smile. ?Strength:- 5/5 equal and symmetric strength both upper and lower extremities.  ? ? ?   ?Assessment & Plan:  ? ?Patient Instructions  ?For you wellness exam today I have ordered cbc, cmp and  lipid panel. ? ?Vaccine up to  date. ? ?Recommend exercise and healthy diet. ? ?We will let you know lab results as they come in. ? ?Follow up date appointment will be determined after lab review.   ? ? ? ? ? Mackie Pai, PA-C  ?

## 2022-02-07 NOTE — Patient Instructions (Addendum)
For you wellness exam today I have ordered cbc, cmp and  lipid panel. ? ?Vaccine up to date. ? ?Recommend exercise and healthy diet. ? ?We will let you know lab results as they come in. ? ?Follow up date appointment will be determined after lab review.   ? ? ?Preventive Care 69-47 Years Old, Male ?Preventive care refers to lifestyle choices and visits with your health care provider that can promote health and wellness. Preventive care visits are also called wellness exams. ?What can I expect for my preventive care visit? ?Counseling ?During your preventive care visit, your health care provider may ask about your: ?Medical history, including: ?Past medical problems. ?Family medical history. ?Current health, including: ?Emotional well-being. ?Home life and relationship well-being. ?Sexual activity. ?Lifestyle, including: ?Alcohol, nicotine or tobacco, and drug use. ?Access to firearms. ?Diet, exercise, and sleep habits. ?Safety issues such as seatbelt and bike helmet use. ?Sunscreen use. ?Work and work Statistician. ?Physical exam ?Your health care provider will check your: ?Height and weight. These may be used to calculate your BMI (body mass index). BMI is a measurement that tells if you are at a healthy weight. ?Waist circumference. This measures the distance around your waistline. This measurement also tells if you are at a healthy weight and may help predict your risk of certain diseases, such as type 2 diabetes and high blood pressure. ?Heart rate and blood pressure. ?Body temperature. ?Skin for abnormal spots. ?What immunizations do I need? ? ?Vaccines are usually given at various ages, according to a schedule. Your health care provider will recommend vaccines for you based on your age, medical history, and lifestyle or other factors, such as travel or where you work. ?What tests do I need? ?Screening ?Your health care provider may recommend screening tests for certain conditions. This may include: ?Lipid and  cholesterol levels. ?Diabetes screening. This is done by checking your blood sugar (glucose) after you have not eaten for a while (fasting). ?Hepatitis B test. ?Hepatitis C test. ?HIV (human immunodeficiency virus) test. ?STI (sexually transmitted infection) testing, if you are at risk. ?Lung cancer screening. ?Prostate cancer screening. ?Colorectal cancer screening. ?Talk with your health care provider about your test results, treatment options, and if necessary, the need for more tests. ?Follow these instructions at home: ?Eating and drinking ? ?Eat a diet that includes fresh fruits and vegetables, whole grains, lean protein, and low-fat dairy products. ?Take vitamin and mineral supplements as recommended by your health care provider. ?Do not drink alcohol if your health care provider tells you not to drink. ?If you drink alcohol: ?Limit how much you have to 0-2 drinks a day. ?Know how much alcohol is in your drink. In the [place] one drink equals one 12 oz bottle of beer (355 mL), one 5 oz glass of wine (148 mL), or one 1? oz glass of hard liquor (44 mL). ?Lifestyle ?Brush your teeth every morning and night with fluoride toothpaste. Floss one time each day. ?Exercise for at least 30 minutes 5 or more days each week. ?Do not use any products that contain nicotine or tobacco. These products include cigarettes, chewing tobacco, and vaping devices, such as e-cigarettes. If you need help quitting, ask your health care provider. ?Do not use drugs. ?If you are sexually active, practice safe sex. Use a condom or other form of protection to prevent STIs. ?Take aspirin only as told by your health care provider. Make sure that you understand how much to take and what form to take. Work with  your health care provider to find out whether it is safe and beneficial for you to take aspirin daily. ?Find healthy ways to manage stress, such as: ?Meditation, yoga, or listening to music. ?Journaling. ?Talking to a trusted  person. ?Spending time with friends and family. ?Minimize exposure to UV radiation to reduce your risk of skin cancer. ?Safety ?Always wear your seat belt while driving or riding in a vehicle. ?Do not drive: ?If you have been drinking alcohol. Do not ride with someone who has been drinking. ?When you are tired or distracted. ?While texting. ?If you have been using any mind-altering substances or drugs. ?Wear a helmet and other protective equipment during sports activities. ?If you have firearms in your house, make sure you follow all gun safety procedures. ?What's next? ?Go to your health care provider once a year for an annual wellness visit. ?Ask your health care provider how often you should have your eyes and teeth checked. ?Stay up to date on all vaccines. ?This information is not intended to replace advice given to you by your health care provider. Make sure you discuss any questions you have with your health care provider. ?Document Revised: 03/22/2021 Document Reviewed: 03/22/2021 ?Elsevier Patient Education ? [place]. ? ? ? ? ? ? ?

## 2022-02-19 ENCOUNTER — Encounter: Payer: Self-pay | Admitting: Medical

## 2022-02-26 ENCOUNTER — Encounter: Payer: Self-pay | Admitting: Medical

## 2022-11-19 IMAGING — CR DG FINGER INDEX 2+V*L*
3 series · 3 of 3 positions shown · non-contrast
Comparison: None.

CLINICAL DATA: Cut finger while chopping vegetables, initial
encounter

EXAM:
LEFT INDEX FINGER 2+V

[x finger pa left]
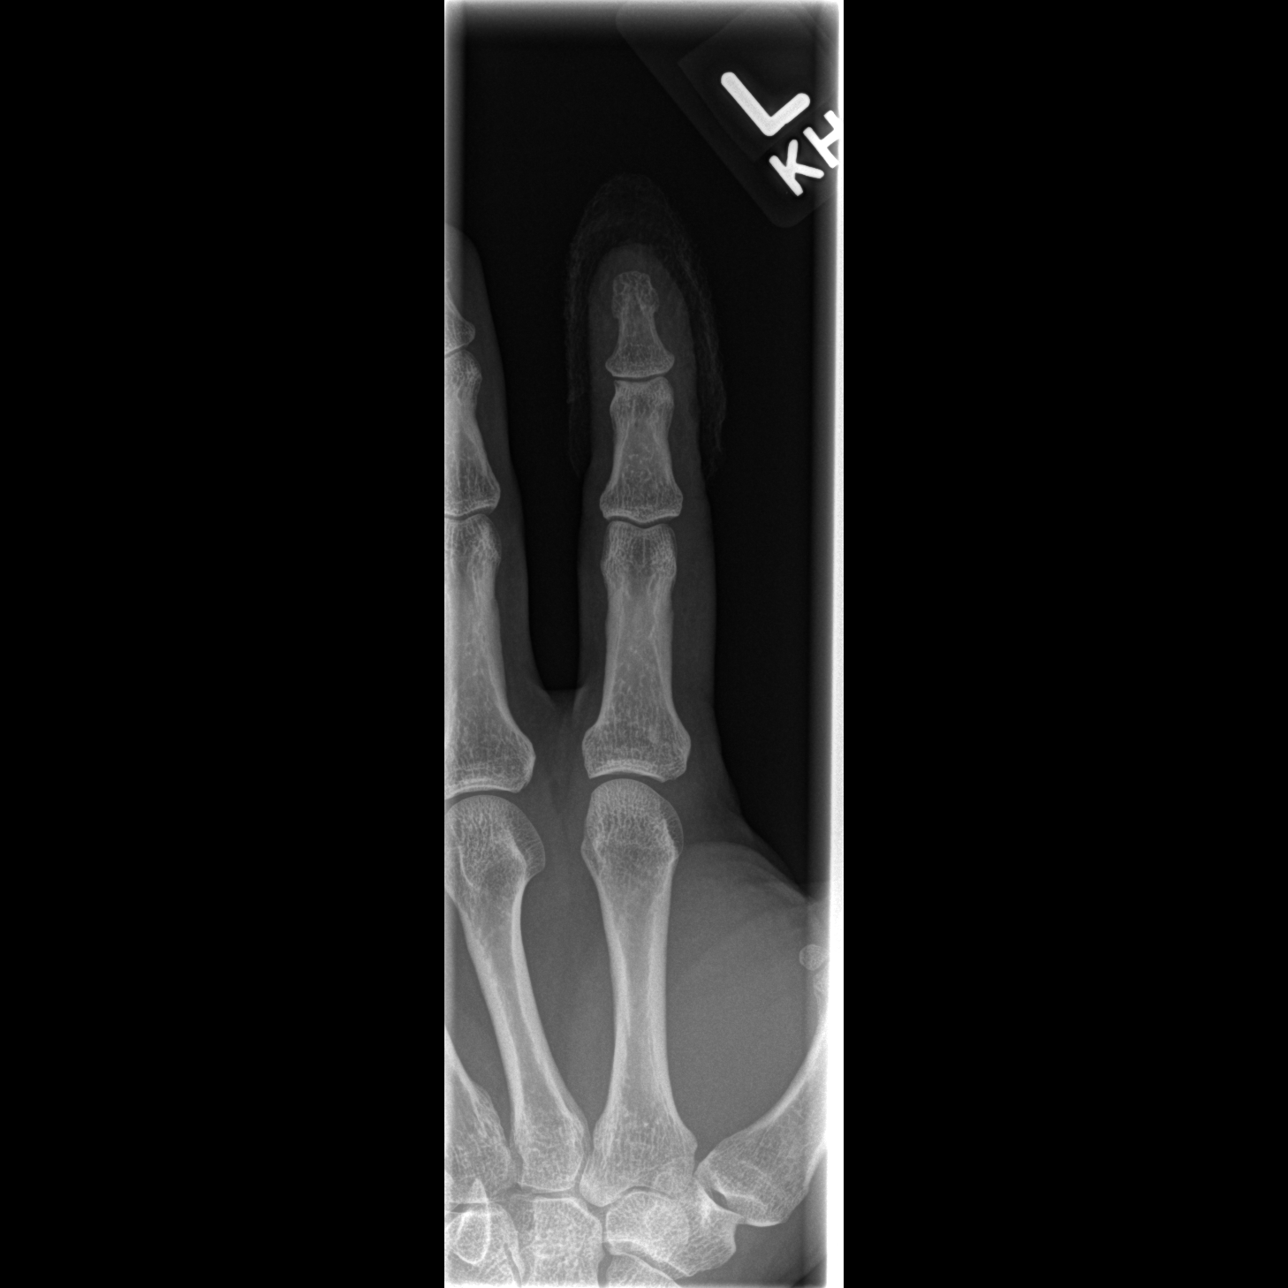

[x finger obl. left]
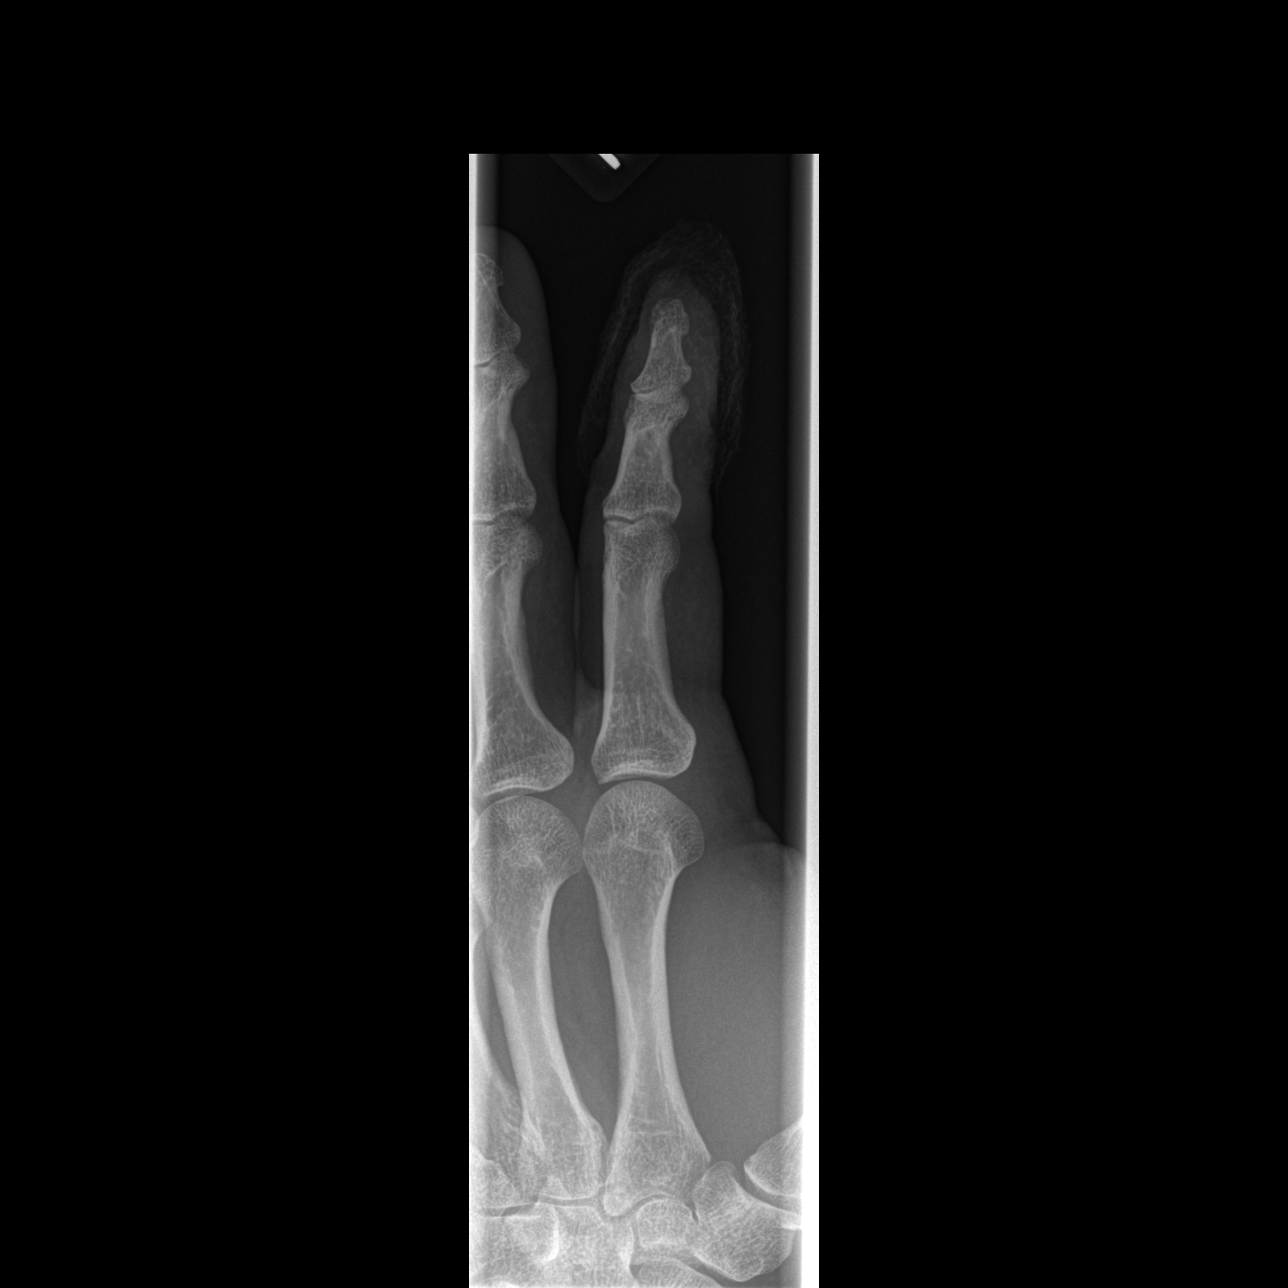

[x finger lateral left]
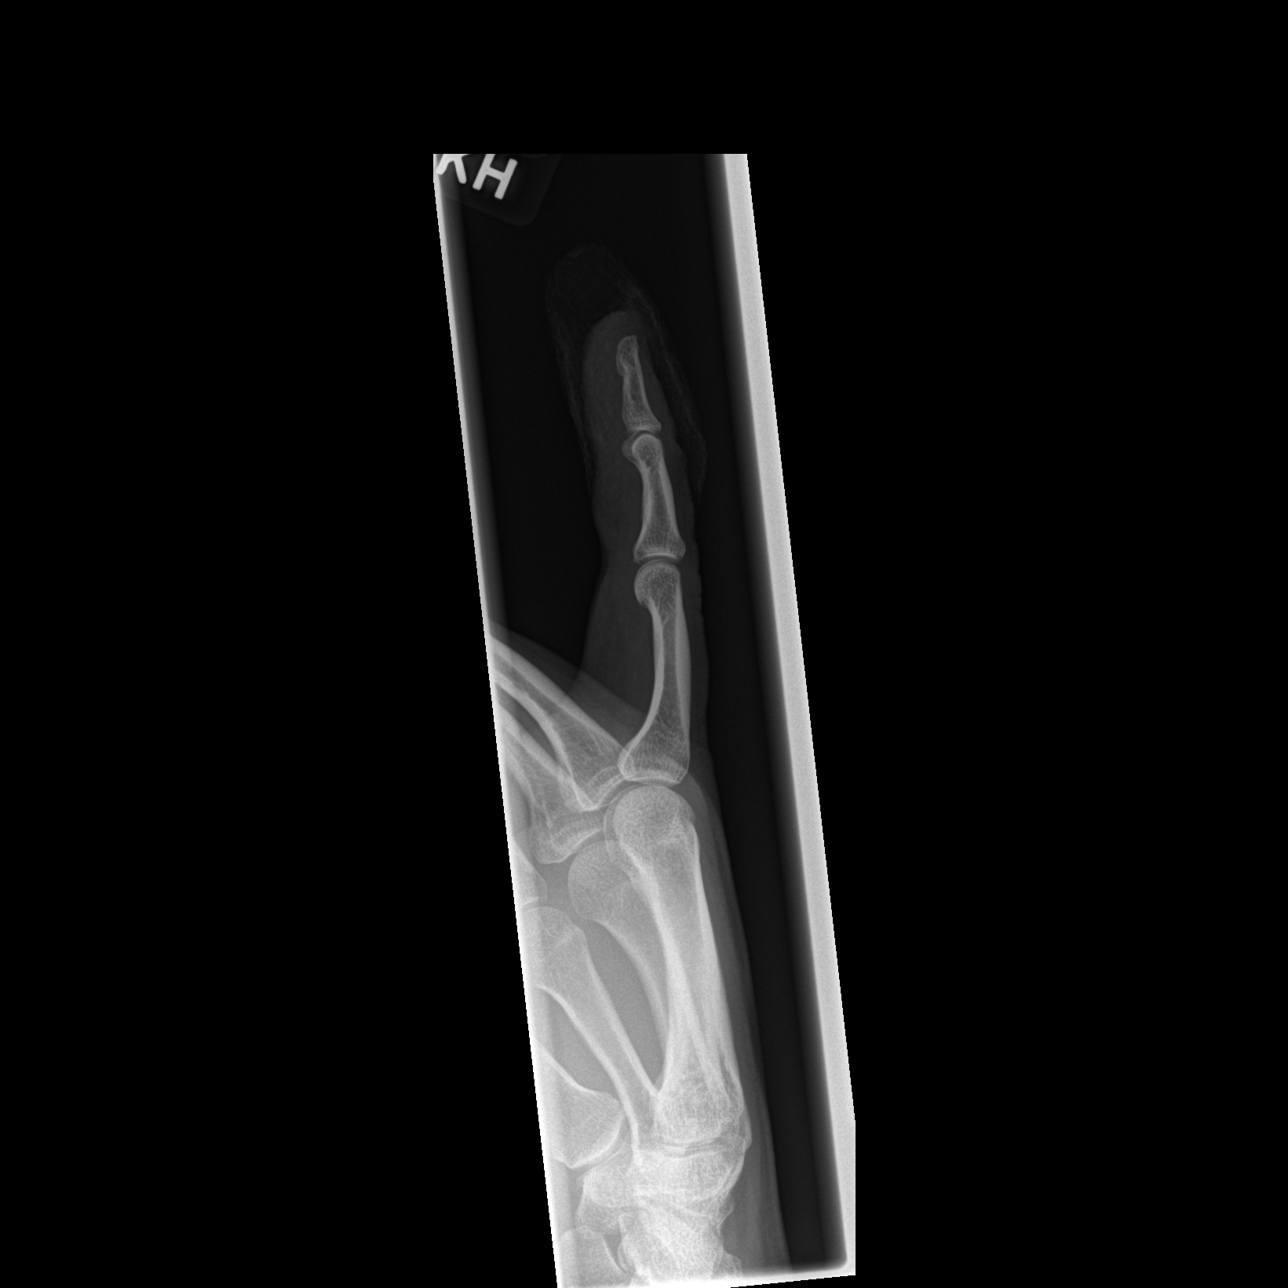

[3 of 3 positions shown; findings below may reference images not displayed]

FINDINGS: Soft tissue injury is noted consistent with the given clinical
history. No acute bony abnormality is seen. No radiopaque foreign
body is seen.
IMPRESSION: Soft tissue injury without acute bony abnormality.

## 2023-02-11 ENCOUNTER — Encounter: Payer: 59 | Admitting: Medical

## 2023-03-07 ENCOUNTER — Ambulatory Visit
Admission: RE | Admit: 2023-03-07 | Discharge: 2023-03-07 | Disposition: A | Discharge status: Home / Self Care | Payer: 59 | Source: Ambulatory Visit | Attending: Urgent Care | Admitting: Urgent Care

## 2023-03-07 VITALS — BP 167/103 | HR 85 | Temp 98.1°F | Resp 18

## 2023-03-07 DIAGNOSIS — L249 Irritant contact dermatitis, unspecified cause: Secondary | ICD-10-CM

## 2023-03-07 MED ORDER — TRIAMCINOLONE ACETONIDE 0.1 % EX CREA: 1.0000 | TOPICAL_CREAM | Freq: Two times a day (BID) | CUTANEOUS | 0 refills | Status: AC

## 2023-03-07 NOTE — ED Provider Notes (Signed)
[facility]  Note:  This document was prepared using Conservation officer, historic buildings and may include unintentional dictation errors.  MRN: [id number] DOB: 1976-02-14  Subjective:   Jeremy Carlson is a 47 y.o. male presenting for 1 week history of suspected insect bite to the back of the right knee. No fever, pain, drainage of pus or bleeding.  Area is very pruritic.  Has tried to avoid scratching it but is having a hard time with it.  No known tick bites.  No joint pains, malaise, fatigue, headaches, cough, throat pain.  No bull's-eye rash.  No current facility-administered medications for this encounter.  Current Outpatient Medications:    loratadine (CLARITIN) 10 MG tablet, Take 10 mg by mouth daily., Disp: , Rfl:    magnesium 30 MG tablet, Take 30 mg by mouth daily., Disp: , Rfl:    Multiple Vitamin (ONE-A-DAY MENS PO), Take by mouth., Disp: , Rfl:    omega-3 acid ethyl esters (LOVAZA) 1 g capsule, Take 1 g by mouth daily., Disp: , Rfl:    No Known Allergies  Past Medical History:  Diagnosis Date   Hyperlipidemia      Past Surgical History:  Procedure Laterality Date   VASECTOMY      Family History  Problem Relation Age of Onset   Hyperlipidemia Mother    Hypertension Mother    Hyperlipidemia Father    Hypertension Father     Social History   Tobacco Use   Smoking status: Never   Smokeless tobacco: Never  Vaping Use   Vaping Use: Never used  Substance Use Topics   Alcohol use: Not Currently   Drug use: Never    ROS   Objective:   Vitals: BP (!) 167/103 (BP Location: Right Arm)   Pulse 85   Temp 98.1 F (36.7 C) (Oral)   Resp 18   SpO2 97%   Physical Exam Constitutional:      General: He is not in acute distress.    Appearance: Normal appearance. He is well-developed and normal weight. He is not ill-appearing, toxic-appearing or diaphoretic.  HENT:     Head: Normocephalic and atraumatic.     Right Ear: External  ear normal.     Left Ear: External ear normal.     Nose: Nose normal.     Mouth/Throat:     Pharynx: Oropharynx is clear.  Eyes:     General: No scleral icterus.       Right eye: No discharge.        Left eye: No discharge.     Extraocular Movements: Extraocular movements intact.  Cardiovascular:     Rate and Rhythm: Normal rate.  Pulmonary:     Effort: Pulmonary effort is normal.  Musculoskeletal:     Cervical back: Normal range of motion.  Skin:    Findings: Rash (macular papular area over posterior medial right knee with resolving central puncture wound) present.  Neurological:     Mental Status: He is alert and oriented to person, place, and time.  Psychiatric:        Mood and Affect: Mood normal.        Behavior: Behavior normal.        Thought Content: Thought content normal.        Judgment: Judgment normal.     Assessment and Plan :   PDMP not reviewed this encounter.  1. Irritant dermatitis     Low suspicion for cellulitis, infectious process and  therefore we will hold off on antibiotic use.  Recommend managing with triamcinolone cream for an irritant dermatitis.  Counseled patient on potential for adverse effects with medications prescribed/recommended today, ER and return-to-clinic precautions discussed, patient verbalized understanding.    Wallis Bamberg, [place] 03/07/23 1610

## 2023-03-07 NOTE — ED Triage Notes (Signed)
Pt c/o ?insect bite to posterior right knee-first noticed ~1week ago-NAD-steady gait

## 2023-03-11 ENCOUNTER — Ambulatory Visit (INDEPENDENT_AMBULATORY_CARE_PROVIDER_SITE_OTHER): Payer: 59 | Admitting: Medical

## 2023-03-11 ENCOUNTER — Encounter: Payer: Self-pay | Admitting: Medical

## 2023-03-11 VITALS — BP 130/85 | HR 94 | Temp 98.7°F | Resp 18 | Ht 68.0 in | Wt 198.0 lb

## 2023-03-11 DIAGNOSIS — Z0000 Encounter for general adult medical examination without abnormal findings: Secondary | ICD-10-CM

## 2023-03-11 DIAGNOSIS — Z1211 Encounter for screening for malignant neoplasm of colon: Secondary | ICD-10-CM | POA: Diagnosis not present

## 2023-03-11 DIAGNOSIS — Z125 Encounter for screening for malignant neoplasm of prostate: Secondary | ICD-10-CM | POA: Diagnosis not present

## 2023-03-11 LAB — COMPREHENSIVE METABOLIC PANEL
ALT: 20 U/L (ref 0–53)
AST: 15 U/L (ref 0–37)
Albumin: 4.6 g/dL (ref 3.5–5.2)
Alkaline Phosphatase: 64 U/L (ref 39–117)
BUN: 16 mg/dL (ref 6–23)
CO2: 27 mEq/L (ref 19–32)
Calcium: 9.3 mg/dL (ref 8.4–10.5)
Chloride: 103 mEq/L (ref 96–112)
Creatinine, Ser: 0.94 mg/dL (ref 0.40–1.50)
GFR: 96.06 mL/min (ref >60.00)
Glucose, Bld: 91 mg/dL (ref 70–99)
Potassium: 4.1 mEq/L (ref 3.5–5.1)
Sodium: 140 mEq/L (ref 135–145)
Total Bilirubin: 0.6 mg/dL (ref 0.2–1.2)
Total Protein: 7.3 g/dL (ref 6.0–8.3)

## 2023-03-11 LAB — CBC WITH DIFFERENTIAL/PLATELET
Basophils Absolute: 0.1 10*3/uL (ref 0.0–0.1)
Basophils Relative: 1.1 % (ref 0.0–3.0)
Eosinophils Absolute: 0.1 10*3/uL (ref 0.0–0.7)
Eosinophils Relative: 1.3 % (ref 0.0–5.0)
HCT: 45.4 % (ref 39.0–52.0)
Hemoglobin: 15.1 g/dL (ref 13.0–17.0)
Lymphocytes Relative: 26.1 % (ref 12.0–46.0)
Lymphs Abs: 1.9 10*3/uL (ref 0.7–4.0)
MCHC: 33.2 g/dL (ref 30.0–36.0)
MCV: 87.7 fl (ref 78.0–100.0)
Monocytes Absolute: 0.5 10*3/uL (ref 0.1–1.0)
Monocytes Relative: 6.8 % (ref 3.0–12.0)
Neutro Abs: 4.7 10*3/uL (ref 1.4–7.7)
Neutrophils Relative %: 64.7 % (ref 43.0–77.0)
Platelets: 243 10*3/uL (ref 150.0–400.0)
RBC: 5.18 Mil/uL (ref 4.22–5.81)
RDW: 13.8 % (ref 11.5–15.5)
WBC: 7.2 10*3/uL (ref 4.0–10.5)

## 2023-03-11 LAB — LIPID PANEL
Cholesterol: 216 mg/dL — ABNORMAL HIGH (ref 0–200)
HDL: 41.4 mg/dL (ref >39.00)
LDL Cholesterol: 145 mg/dL — ABNORMAL HIGH (ref 0–99)
NonHDL: 174.5
Total CHOL/HDL Ratio: 5
Triglycerides: 148 mg/dL (ref 0.0–149.0)
VLDL: 29.6 mg/dL (ref 0.0–40.0)

## 2023-03-11 LAB — PSA: PSA: 0.64 ng/mL (ref 0.10–4.00)

## 2023-03-11 NOTE — Patient Instructions (Addendum)
For you wellness exam today I have ordered cbc, cmp and  lipid panel.  Vaccine up to date.   Placed screening colonoscopy referral again. Recommend you call them.  Recommend exercise and healthy diet.  We will let you know lab results as they come in.  Follow up date appointment will be determined after lab review.    Bp better on recheck. Appears you have some white coat bp elevation. But want to confirm bp controlled at home. Check bp daily over next week. Want to see all reading less than 140/90. Hopefully closer to 130/80.   Preventive Care 20-42 Years Old, Male Preventive care refers to lifestyle choices and visits with your health care provider that can promote health and wellness. Preventive care visits are also called wellness exams. What can I expect for my preventive care visit? Counseling During your preventive care visit, your health care provider may ask about your: Medical history, including: Past medical problems. Family medical history. Current health, including: Emotional well-being. Home life and relationship well-being. Sexual activity. Lifestyle, including: Alcohol, nicotine or tobacco, and drug use. Access to firearms. Diet, exercise, and sleep habits. Safety issues such as seatbelt and bike helmet use. Sunscreen use. Work and work Astronomer. Physical exam Your health care provider will check your: Height and weight. These may be used to calculate your BMI (body mass index). BMI is a measurement that tells if you are at a healthy weight. Waist circumference. This measures the distance around your waistline. This measurement also tells if you are at a healthy weight and may help predict your risk of certain diseases, such as type 2 diabetes and high blood pressure. Heart rate and blood pressure. Body temperature. Skin for abnormal spots. What immunizations do I need?  Vaccines are usually given at various ages, according to a schedule. Your health  care provider will recommend vaccines for you based on your age, medical history, and lifestyle or other factors, such as travel or where you work. What tests do I need? Screening Your health care provider may recommend screening tests for certain conditions. This may include: Lipid and cholesterol levels. Diabetes screening. This is done by checking your blood sugar (glucose) after you have not eaten for a while (fasting). Hepatitis B test. Hepatitis C test. HIV (human immunodeficiency virus) test. STI (sexually transmitted infection) testing, if you are at risk. Lung cancer screening. Prostate cancer screening. Colorectal cancer screening. Talk with your health care provider about your test results, treatment options, and if necessary, the need for more tests. Follow these instructions at home: Eating and drinking  Eat a diet that includes fresh fruits and vegetables, whole grains, lean protein, and low-fat dairy products. Take vitamin and mineral supplements as recommended by your health care provider. Do not drink alcohol if your health care provider tells you not to drink. If you drink alcohol: Limit how much you have to 0-2 drinks a day. Know how much alcohol is in your drink. In the [place] one drink equals one 12 oz bottle of beer (355 mL), one 5 oz glass of wine (148 mL), or one 1 oz glass of hard liquor (44 mL). Lifestyle Brush your teeth every morning and night with fluoride toothpaste. Floss one time each day. Exercise for at least 30 minutes 5 or more days each week. Do not use any products that contain nicotine or tobacco. These products include cigarettes, chewing tobacco, and vaping devices, such as e-cigarettes. If you need help quitting, ask your health care  provider. Do not use drugs. If you are sexually active, practice safe sex. Use a condom or other form of protection to prevent STIs. Take aspirin only as told by your health care provider. Make sure that you  understand how much to take and what form to take. Work with your health care provider to find out whether it is safe and beneficial for you to take aspirin daily. Find healthy ways to manage stress, such as: Meditation, yoga, or listening to music. Journaling. Talking to a trusted person. Spending time with friends and family. Minimize exposure to UV radiation to reduce your risk of skin cancer. Safety Always wear your seat belt while driving or riding in a vehicle. Do not drive: If you have been drinking alcohol. Do not ride with someone who has been drinking. When you are tired or distracted. While texting. If you have been using any mind-altering substances or drugs. Wear a helmet and other protective equipment during sports activities. If you have firearms in your house, make sure you follow all gun safety procedures. What's next? Go to your health care provider once a year for an annual wellness visit. Ask your health care provider how often you should have your eyes and teeth checked. Stay up to date on all vaccines. This information is not intended to replace advice given to you by your health care provider. Make sure you discuss any questions you have with your health care provider. Document Revised: 03/22/2021 Document Reviewed: 03/22/2021 Elsevier Patient Education  2024 [employer].

## 2023-03-11 NOTE — Progress Notes (Signed)
Subjective:    Patient ID: Armanii Boelens, male    DOB: 1975/10/22, 47 y.o.   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 Pt just started to watch what he is eating. He has been walking with his wife. He does not drink alcohol. None smoker.    Pt has not been checking his blood pressure recently.   I had placed referral for colonoscopy. Pt has not got it done. States he is reluctant.    Review of Systems  Constitutional:  Negative for chills and fatigue.  HENT:  Negative for congestion, facial swelling, postnasal drip and rhinorrhea.   Respiratory:  Negative for cough, chest tightness, shortness of breath and wheezing.   Cardiovascular:  Negative for chest pain and palpitations.  Gastrointestinal:  Negative for abdominal pain, blood in stool and constipation.  Genitourinary:  Negative for dysuria, flank pain and frequency.  Musculoskeletal:  Negative for back pain and joint swelling.  Skin:  Negative for rash.  Neurological:  Negative for syncope, facial asymmetry, speech difficulty and light-headedness.  Hematological:  Negative for adenopathy. Does not bruise/bleed easily.  Psychiatric/Behavioral:  Negative for agitation, confusion and dysphoric mood.    Past Medical History:  Diagnosis Date   Hyperlipidemia      Social History   Socioeconomic History   Marital status: Married    Spouse name: Not on file   Number of children: Not on file   Years of education: Not on file   Highest education level: Not on file  Occupational History   Not on file  Tobacco Use   Smoking status: Never   Smokeless tobacco: Never  Vaping Use   Vaping Use: Never used  Substance and Sexual Activity   Alcohol use: Not Currently   Drug use: Never   Sexual activity: Yes  Other Topics Concern   Not on file  Social History Narrative   Not on file   Social Determinants of Health   Financial Resource Strain: Not on file  Food Insecurity:  Not on file  Transportation Needs: Not on file  Physical Activity: Not on file  Stress: Not on file  Social Connections: Not on file  Intimate Partner Violence: Not on file    Past Surgical History:  Procedure Laterality Date   VASECTOMY      Family History  Problem Relation Age of Onset   Hyperlipidemia Mother    Hypertension Mother    Hyperlipidemia Father    Hypertension Father     No Known Allergies  Current Outpatient Medications on File Prior to Visit  Medication Sig Dispense Refill   loratadine (CLARITIN) 10 MG tablet Take 10 mg by mouth daily.     magnesium 30 MG tablet Take 30 mg by mouth daily.     Multiple Vitamin (ONE-A-DAY MENS PO) Take by mouth.     omega-3 acid ethyl esters (LOVAZA) 1 g capsule Take 1 g by mouth daily.     triamcinolone cream (KENALOG) 0.1 % Apply 1 Application topically 2 (two) times daily. 45 g 0   No current facility-administered medications on file prior to visit.    BP 130/85   Pulse 94   Temp 98.7 F (37.1 C)   Resp 18   Ht 5\' 8"  (1.727 m)   Wt 198 lb (89.8 kg)   SpO2 99%   BMI 30.11 kg/m         Objective:   Physical Exam  General Mental Status-  Alert. General Appearance- Not in acute distress.   Skin General: Color- Normal Color. Moisture- Normal Moisture.  Neck No thyromegaly. No lymphadenopathy.  Chest and Lung Exam Auscultation: Breath Sounds:-Normal.  Cardiovascular Auscultation:Rythm- Regular. Murmurs & Other Heart Sounds:Auscultation of the heart reveals- No Murmurs.  Abdomen Inspection:-Inspeection Normal. Palpation/Percussion:Note:No mass. Palpation and Percussion of the abdomen reveal- Non Tender, Non Distended + BS, no rebound or guarding.    Neurologic Cranial Nerve exam:- CN III-XII intact(No nystagmus), symmetric smile. Eom intact. Strength:- 5/5 equal and symmetric strength both upper and lower extremities.       Assessment & Plan:   Patient Instructions  For you wellness exam  today I have ordered cbc, cmp and  lipid panel.  Vaccine up to date.   Placed screening colonoscopy referral again. Recommend you call them.  Recommend exercise and healthy diet.  We will let you know lab results as they come in.  Follow up date appointment will be determined after lab review.    Bp better on recheck. Appears you have some white coat bp elevation. But want to confirm bp controlled at home. Check bp daily over next week. Want to see all reading less than 140/90. Hopefully closer to 130/80.      Esperanza Richters, PA-C
